# Patient Record
Sex: Male | Born: 1965
Health system: Southern US, Community
[De-identification: ages and names within clinical notes are randomized; demographics above are authoritative.]

## PROBLEM LIST (undated history)

## (undated) DIAGNOSIS — M2550 Pain in unspecified joint: Secondary | ICD-10-CM

## (undated) DIAGNOSIS — R7303 Prediabetes: Secondary | ICD-10-CM

## (undated) DIAGNOSIS — G4730 Sleep apnea, unspecified: Secondary | ICD-10-CM

## (undated) DIAGNOSIS — Z889 Allergy status to unspecified drugs, medicaments and biological substances status: Secondary | ICD-10-CM

## (undated) DIAGNOSIS — G4733 Obstructive sleep apnea (adult) (pediatric): Secondary | ICD-10-CM

## (undated) DIAGNOSIS — R7989 Other specified abnormal findings of blood chemistry: Secondary | ICD-10-CM

## (undated) DIAGNOSIS — M549 Dorsalgia, unspecified: Secondary | ICD-10-CM

## (undated) DIAGNOSIS — I10 Essential (primary) hypertension: Secondary | ICD-10-CM

## (undated) DIAGNOSIS — R12 Heartburn: Secondary | ICD-10-CM

## (undated) DIAGNOSIS — G43909 Migraine, unspecified, not intractable, without status migrainosus: Secondary | ICD-10-CM

## (undated) DIAGNOSIS — K760 Fatty (change of) liver, not elsewhere classified: Secondary | ICD-10-CM

## (undated) DIAGNOSIS — R945 Abnormal results of liver function studies: Secondary | ICD-10-CM

## (undated) DIAGNOSIS — T7840XA Allergy, unspecified, initial encounter: Secondary | ICD-10-CM

## (undated) DIAGNOSIS — K219 Gastro-esophageal reflux disease without esophagitis: Secondary | ICD-10-CM

## (undated) DIAGNOSIS — Z9189 Other specified personal risk factors, not elsewhere classified: Secondary | ICD-10-CM

## (undated) HISTORY — DX: Heartburn: R12

## (undated) HISTORY — PX: OTHER SURGICAL HISTORY: SHX169

## (undated) HISTORY — DX: Pain in unspecified joint: M25.50

## (undated) HISTORY — DX: Allergy status to unspecified drugs, medicaments and biological substances: Z88.9

## (undated) HISTORY — DX: Hemochromatosis, unspecified: E83.119

## (undated) HISTORY — DX: Obstructive sleep apnea (adult) (pediatric): G47.33

## (undated) HISTORY — DX: Migraine, unspecified, not intractable, without status migrainosus: G43.909

## (undated) HISTORY — DX: Allergy, unspecified, initial encounter: T78.40XA

## (undated) HISTORY — DX: Essential (primary) hypertension: I10

## (undated) HISTORY — DX: Other specified abnormal findings of blood chemistry: R79.89

## (undated) HISTORY — DX: Fatty (change of) liver, not elsewhere classified: K76.0

## (undated) HISTORY — DX: Gastro-esophageal reflux disease without esophagitis: K21.9

## (undated) HISTORY — DX: Prediabetes: R73.03

## (undated) HISTORY — DX: Dorsalgia, unspecified: M54.9

## (undated) HISTORY — DX: Sleep apnea, unspecified: G47.30

## (undated) HISTORY — DX: Abnormal results of liver function studies: R94.5

## (undated) HISTORY — DX: Other specified personal risk factors, not elsewhere classified: Z91.89

---

## 2001-01-17 ENCOUNTER — Encounter: Admission: RE | Admit: 2001-01-17 | Discharge: 2001-01-17 | Payer: Self-pay | Admitting: Internal Medicine

## 2001-01-17 ENCOUNTER — Encounter: Payer: Self-pay | Admitting: Internal Medicine

## 2001-09-16 ENCOUNTER — Emergency Department (HOSPITAL_COMMUNITY): Admission: EM | Admit: 2001-09-16 | Discharge: 2001-09-16 | Payer: Self-pay | Admitting: Emergency Medicine

## 2011-01-01 ENCOUNTER — Other Ambulatory Visit: Payer: Self-pay | Admitting: Family Medicine

## 2011-01-01 DIAGNOSIS — R1031 Right lower quadrant pain: Secondary | ICD-10-CM

## 2011-01-07 ENCOUNTER — Ambulatory Visit
Admission: RE | Admit: 2011-01-07 | Discharge: 2011-01-07 | Disposition: A | Discharge status: Home / Self Care | Payer: 59 | Source: Ambulatory Visit | Attending: Family Medicine | Admitting: Family Medicine

## 2011-01-07 DIAGNOSIS — R1031 Right lower quadrant pain: Secondary | ICD-10-CM

## 2013-05-14 ENCOUNTER — Ambulatory Visit: Payer: 59 | Admitting: Cardiology

## 2013-10-26 ENCOUNTER — Encounter: Payer: Self-pay | Admitting: *Deleted

## 2014-08-15 ENCOUNTER — Ambulatory Visit
Admission: RE | Admit: 2014-08-15 | Discharge: 2014-08-15 | Disposition: A | Discharge status: Home / Self Care | Payer: 59 | Source: Ambulatory Visit | Attending: Family Medicine | Admitting: Family Medicine

## 2014-08-15 ENCOUNTER — Other Ambulatory Visit: Payer: Self-pay | Admitting: Family Medicine

## 2014-08-15 DIAGNOSIS — R52 Pain, unspecified: Secondary | ICD-10-CM

## 2015-06-05 ENCOUNTER — Other Ambulatory Visit: Payer: Self-pay | Admitting: Family Medicine

## 2015-06-05 DIAGNOSIS — R748 Abnormal levels of other serum enzymes: Secondary | ICD-10-CM

## 2015-06-10 ENCOUNTER — Other Ambulatory Visit: Payer: 59

## 2015-06-13 ENCOUNTER — Ambulatory Visit
Admission: RE | Admit: 2015-06-13 | Discharge: 2015-06-13 | Disposition: A | Discharge status: Home / Self Care | Payer: 59 | Source: Ambulatory Visit | Attending: Family Medicine | Admitting: Family Medicine

## 2015-06-13 DIAGNOSIS — R748 Abnormal levels of other serum enzymes: Secondary | ICD-10-CM

## 2016-06-03 DIAGNOSIS — K219 Gastro-esophageal reflux disease without esophagitis: Secondary | ICD-10-CM | POA: Diagnosis not present

## 2016-06-03 DIAGNOSIS — I10 Essential (primary) hypertension: Secondary | ICD-10-CM | POA: Diagnosis not present

## 2016-06-03 DIAGNOSIS — J309 Allergic rhinitis, unspecified: Secondary | ICD-10-CM | POA: Diagnosis not present

## 2016-07-05 IMAGING — CR DG HIP (WITH OR WITHOUT PELVIS) 2-3V*R*
3 series · 3 of 3 positions shown · non-contrast
Comparison: CT 01/07/2011

CLINICAL DATA: 49-year-old male with a history of right anterior
hip pain. No trauma.

EXAM:
RIGHT HIP (WITH PELVIS) 2-3 VIEWS

[w pelvis (1 of 2)]
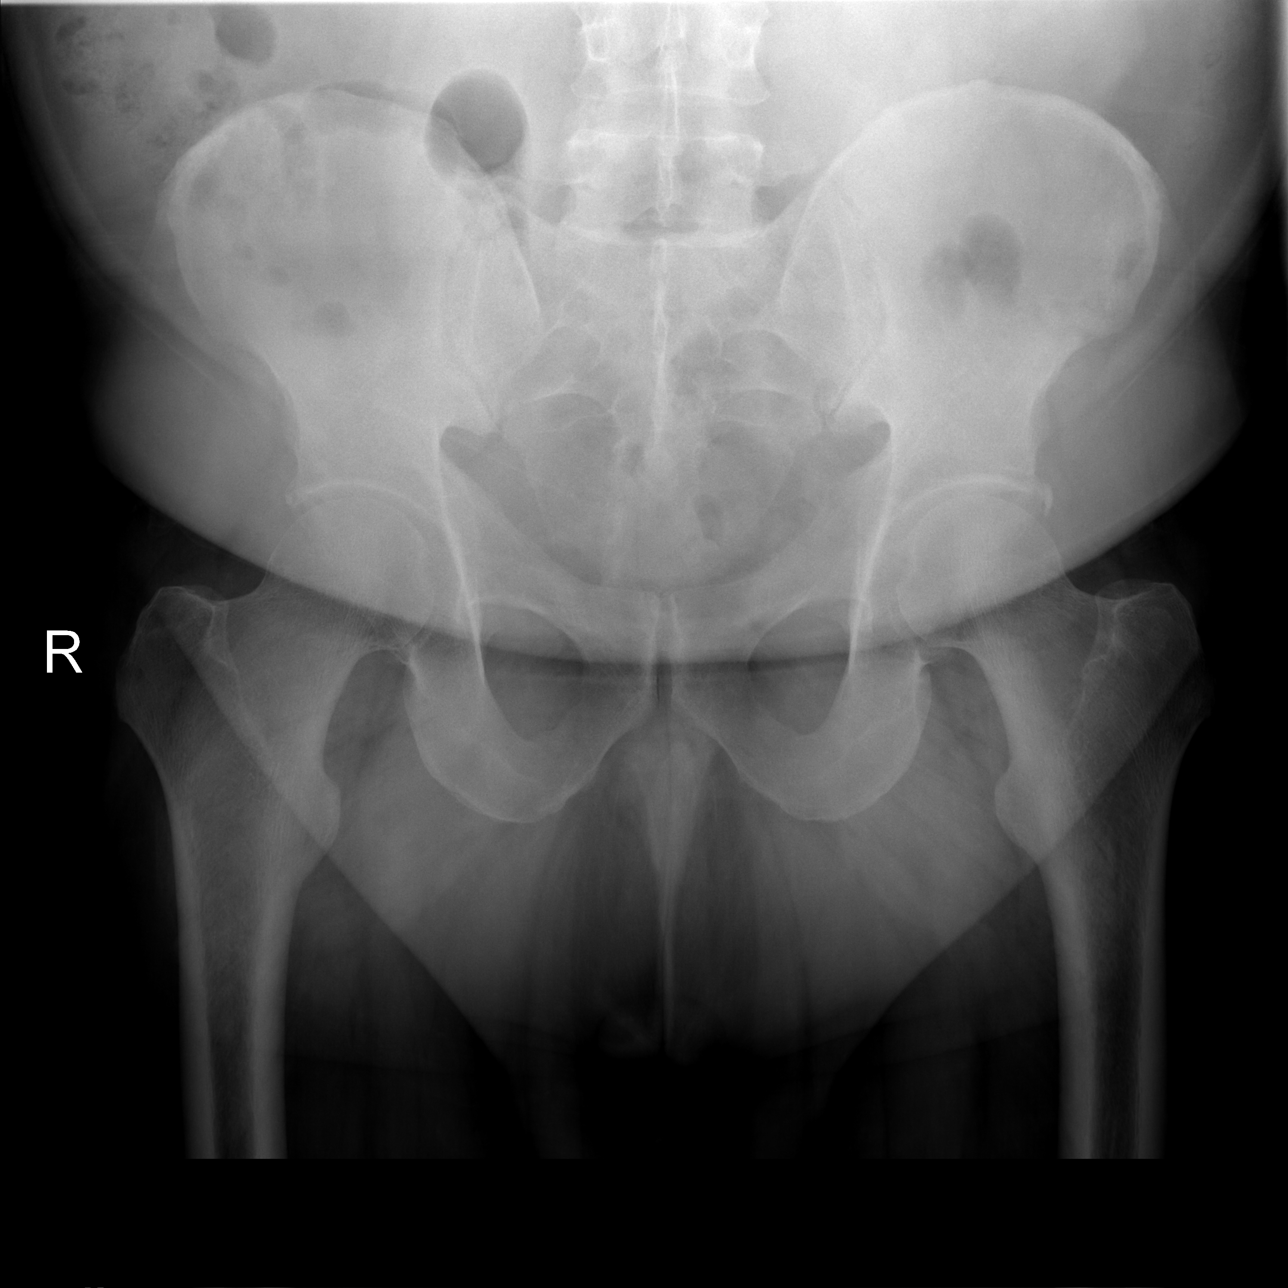

[w pelvis (2 of 2)]
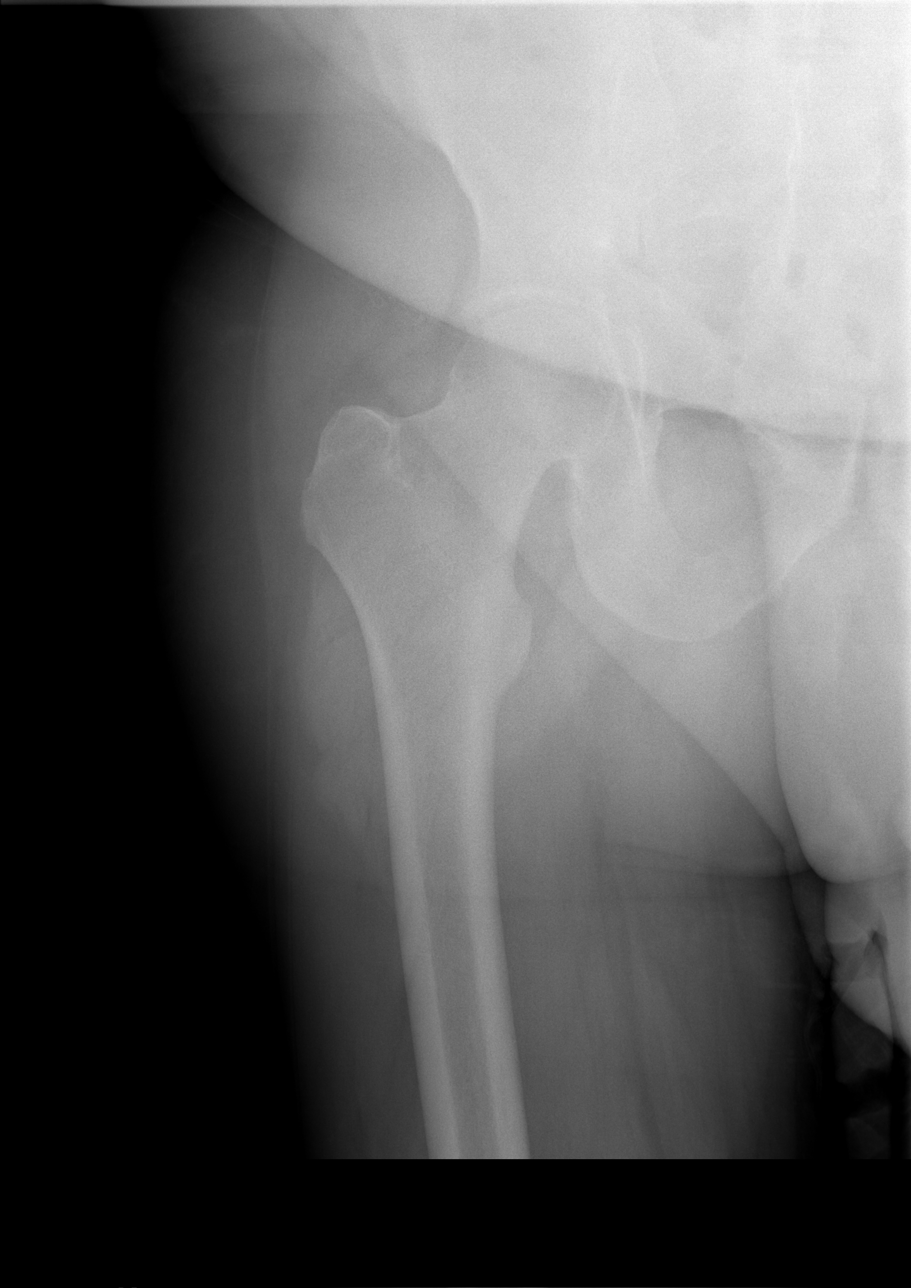

[t hip frog leg right]
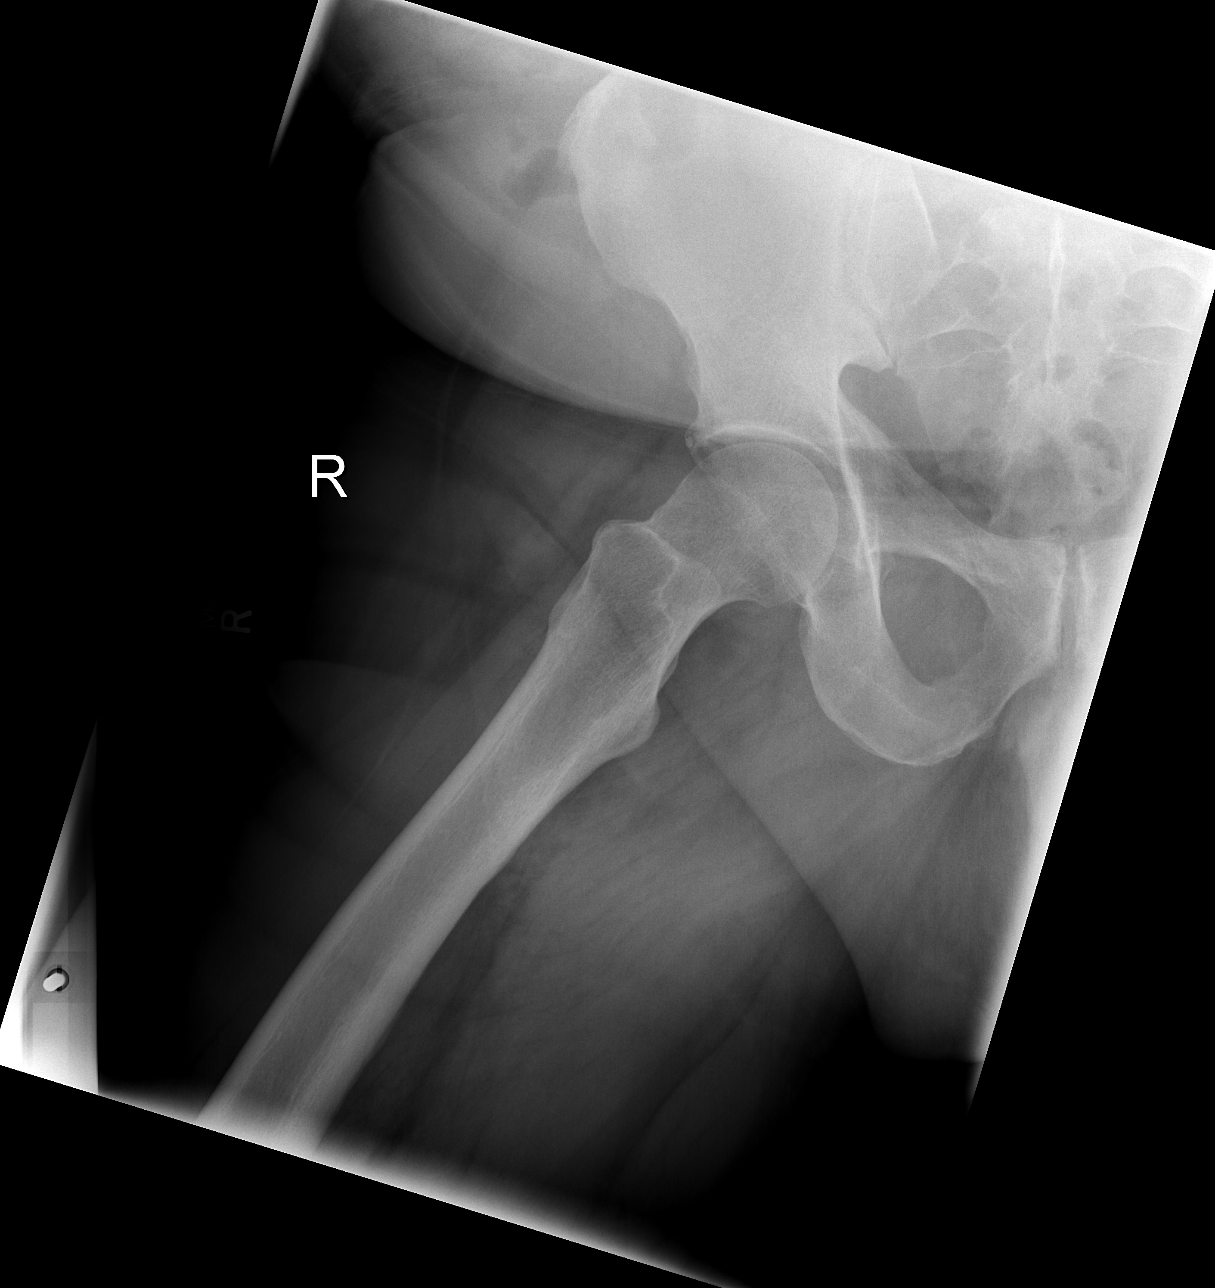

[3 of 3 positions shown; findings below may reference images not displayed]

FINDINGS: Bony pelvic ring appears intact. No acute bony abnormality.
Bilateral hips projects normally over the acetabula. Visualized
aspects the proximal femurs unremarkable.

Right hip appears congruent. Fat planes are maintained on the
anterior view.

Mild changes of osteoarthritis bilaterally.

The configuration of the femoral head and neck junction on the right
demonstrates obtuse margin at the transition, which can be seen with
CAM type femoroacetabular impingement syndrome.
IMPRESSION: Negative for acute bony abnormality.

Mild changes of osteoarthritis.

The aspherical configuration of the right femoral head/ neck
junction with obtuse transition can be seen with CAM type
femoroacetabular impingement.

## 2016-11-19 DIAGNOSIS — G4733 Obstructive sleep apnea (adult) (pediatric): Secondary | ICD-10-CM | POA: Diagnosis not present

## 2016-11-30 DIAGNOSIS — M25561 Pain in right knee: Secondary | ICD-10-CM | POA: Diagnosis not present

## 2016-12-07 DIAGNOSIS — I10 Essential (primary) hypertension: Secondary | ICD-10-CM | POA: Diagnosis not present

## 2016-12-07 DIAGNOSIS — K219 Gastro-esophageal reflux disease without esophagitis: Secondary | ICD-10-CM | POA: Diagnosis not present

## 2016-12-07 DIAGNOSIS — Z125 Encounter for screening for malignant neoplasm of prostate: Secondary | ICD-10-CM | POA: Diagnosis not present

## 2016-12-07 DIAGNOSIS — Z0000 Encounter for general adult medical examination without abnormal findings: Secondary | ICD-10-CM | POA: Diagnosis not present

## 2016-12-27 DIAGNOSIS — M25561 Pain in right knee: Secondary | ICD-10-CM | POA: Diagnosis not present

## 2017-05-03 IMAGING — US US ABDOMEN COMPLETE
1 series · 13 of 25 positions shown · non-contrast
Comparison: CT abdomen and pelvis January 07, 2011

CLINICAL DATA: Elevated liver enzymes

EXAM:
ABDOMEN ULTRASOUND COMPLETE

[Series 1: us abdomen complete · 0.32mm/px · 13 of 70 slices shown]
[im 1/70]
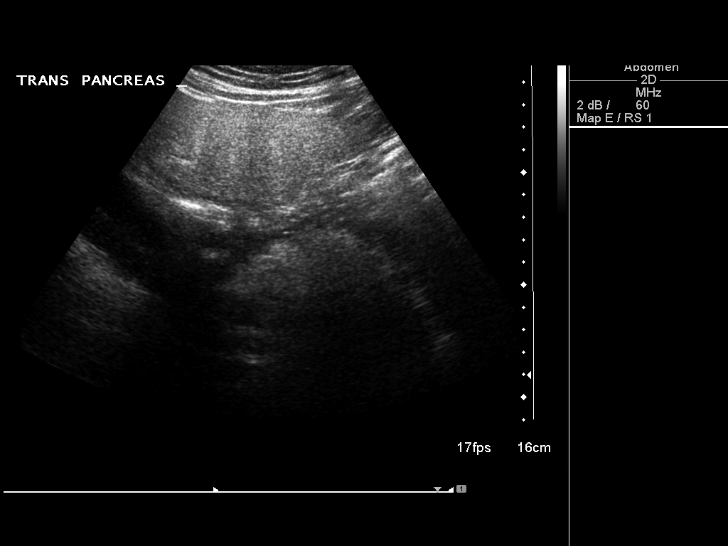
[im 6/70]
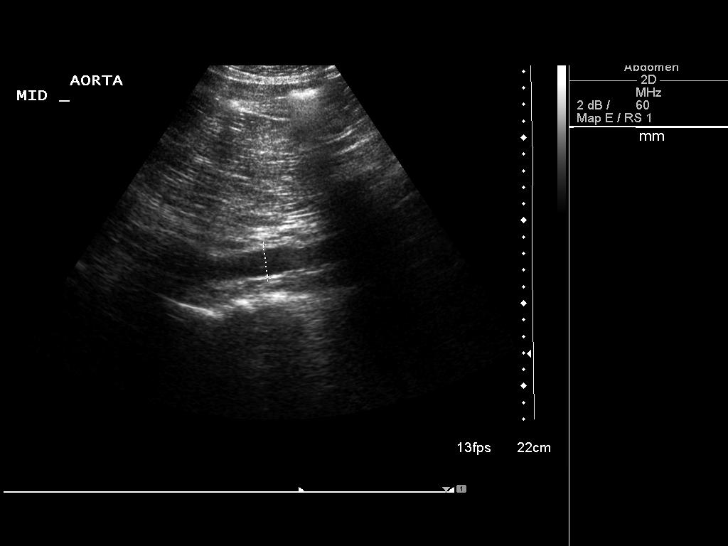
[im 12/70]
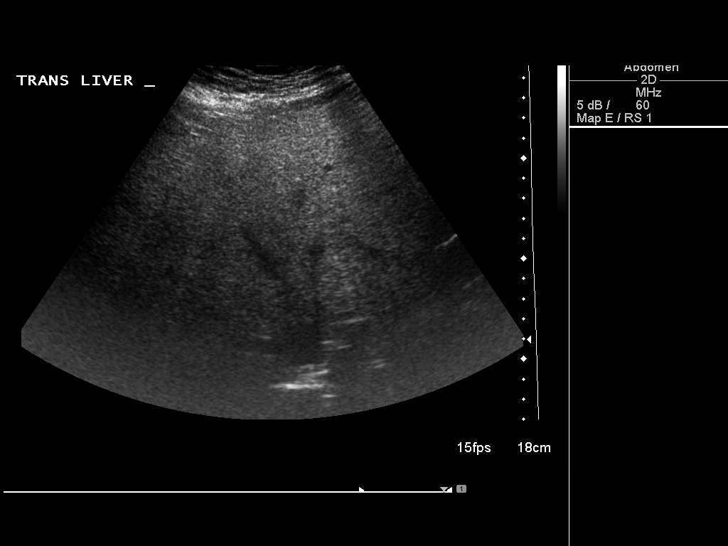
[im 18/70]
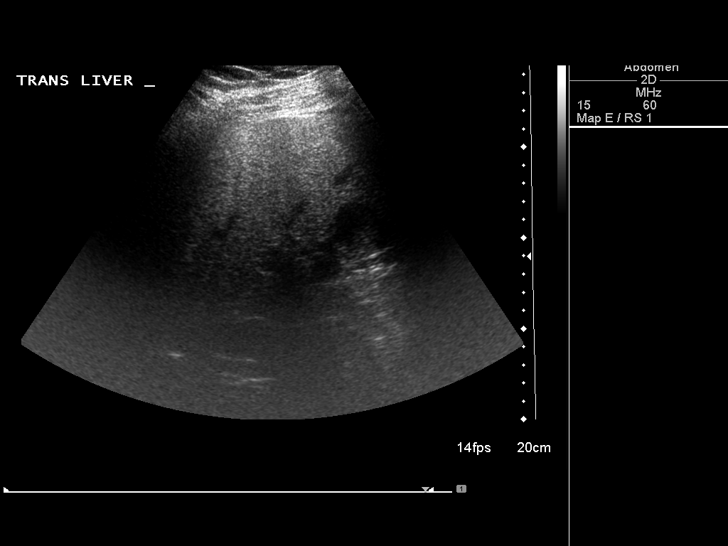
[im 24/70]
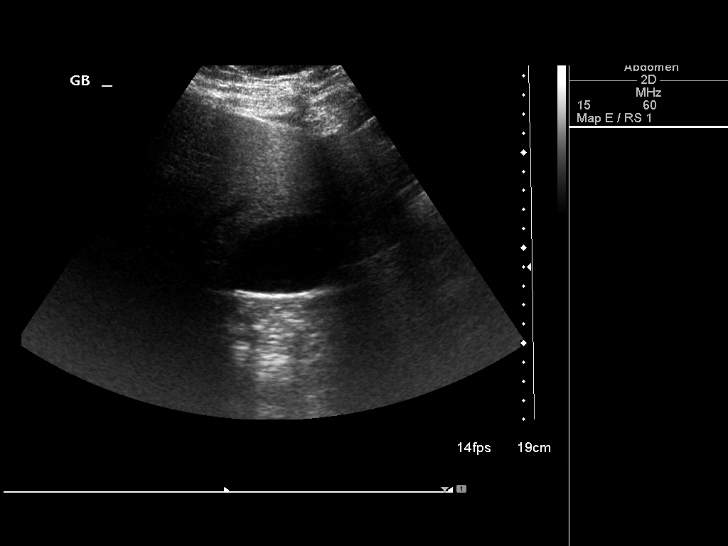
[im 29/70]
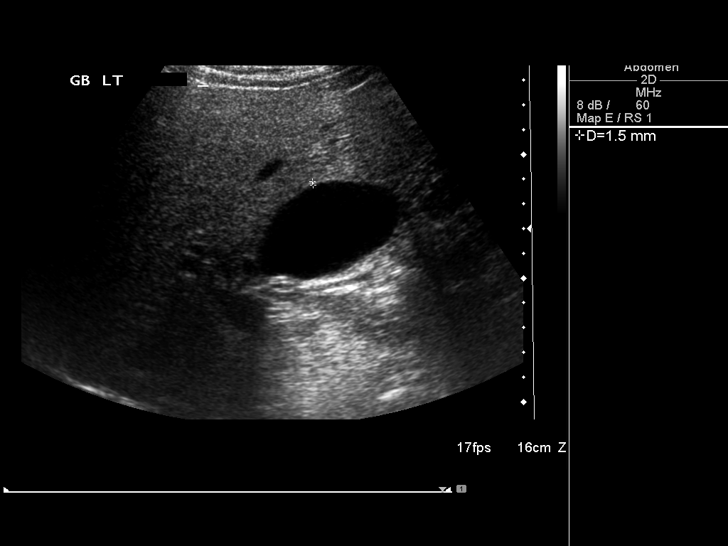
[im 35/70]
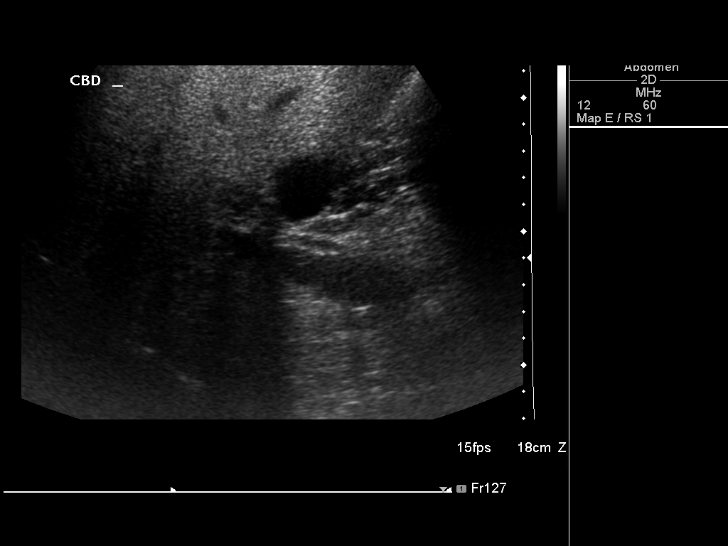
[im 41/70]
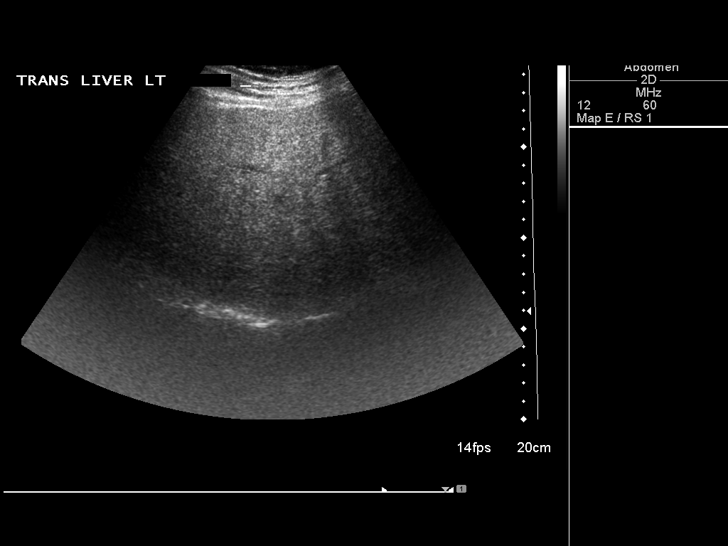
[im 47/70]
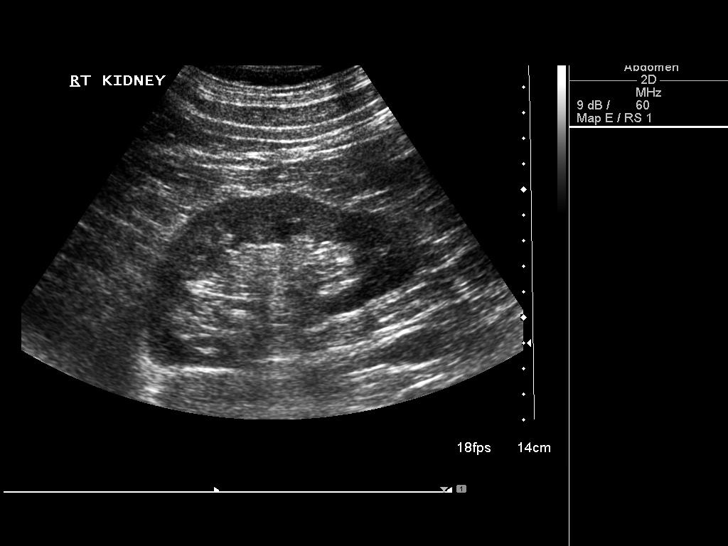
[im 52/70]
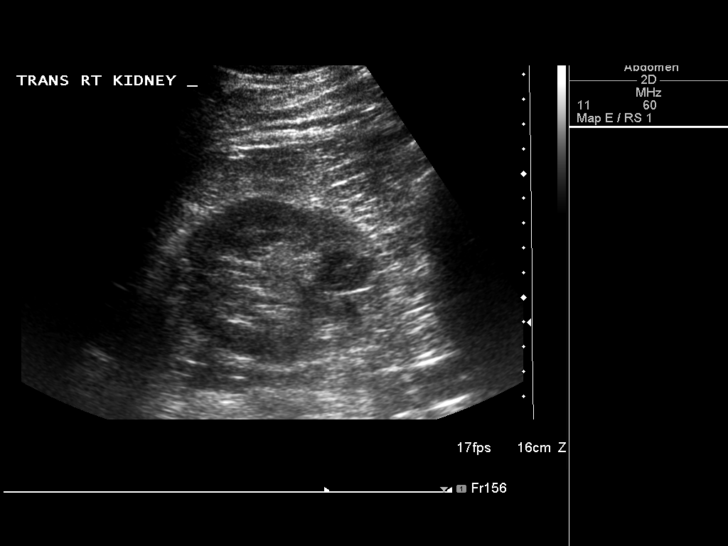
[im 58/70]
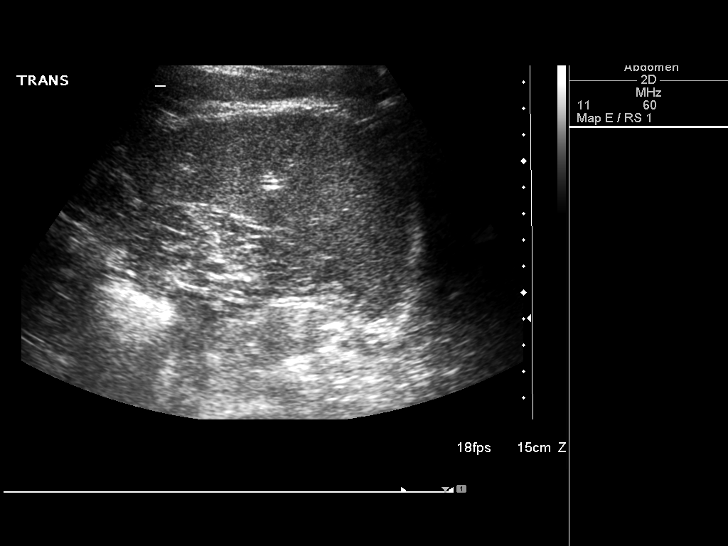
[im 64/70]
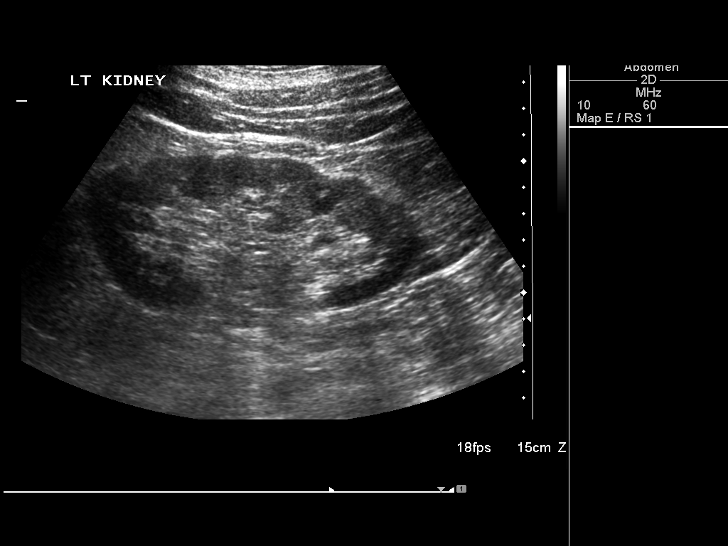
[im 70/70]
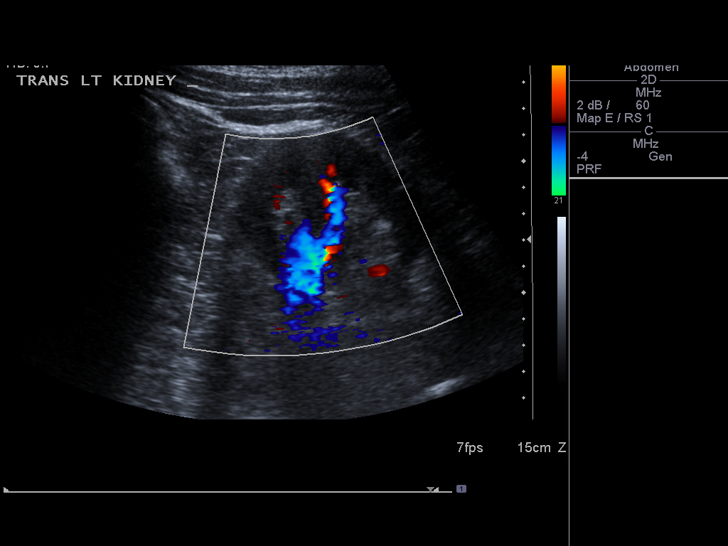

[13 of 25 positions shown; findings below may reference images not displayed]

FINDINGS: Gallbladder: No gallstones or wall thickening visualized. There is
no pericholecystic fluid. No sonographic Murphy sign noted by
sonographer.

Common bile duct: Diameter: 3 mm. There is no intrahepatic, common
hepatic, or common bile duct dilatation.

Liver: No focal lesion identified. Liver echogenicity is diffusely
increased.

IVC: No abnormality visualized.

Pancreas: Visualized portion unremarkable. Portions of the pancreas
are obscured by gas.

Spleen: Size and appearance within normal limits.

Right Kidney: Length: 11.9 cm. Echogenicity within normal limits. No
mass or hydronephrosis visualized.

Left Kidney: Length: 12.2 cm. Echogenicity within normal limits. No
mass or hydronephrosis visualized.

Abdominal aorta: No aneurysm visualized.

Other findings: No demonstrable ascites.
IMPRESSION: Diffuse increase in liver echogenicity, a finding most likely due to
hepatic steatosis. While no focal liver lesions are identified, it
must be cautioned that the sensitivity of ultrasound for focal liver
lesions is diminished in this circumstance. Portions of the pancreas
are obscured by gas. The visualized portions of the pancreas appear
normal. Study otherwise unremarkable.

## 2017-06-09 DIAGNOSIS — J309 Allergic rhinitis, unspecified: Secondary | ICD-10-CM | POA: Diagnosis not present

## 2017-06-09 DIAGNOSIS — K219 Gastro-esophageal reflux disease without esophagitis: Secondary | ICD-10-CM | POA: Diagnosis not present

## 2017-06-09 DIAGNOSIS — I10 Essential (primary) hypertension: Secondary | ICD-10-CM | POA: Diagnosis not present

## 2017-08-03 DIAGNOSIS — G4733 Obstructive sleep apnea (adult) (pediatric): Secondary | ICD-10-CM | POA: Diagnosis not present

## 2017-12-16 DIAGNOSIS — Z0000 Encounter for general adult medical examination without abnormal findings: Secondary | ICD-10-CM | POA: Diagnosis not present

## 2017-12-16 DIAGNOSIS — I10 Essential (primary) hypertension: Secondary | ICD-10-CM | POA: Diagnosis not present

## 2017-12-16 DIAGNOSIS — R7301 Impaired fasting glucose: Secondary | ICD-10-CM | POA: Diagnosis not present

## 2017-12-16 DIAGNOSIS — K219 Gastro-esophageal reflux disease without esophagitis: Secondary | ICD-10-CM | POA: Diagnosis not present

## 2017-12-16 DIAGNOSIS — Z13220 Encounter for screening for lipoid disorders: Secondary | ICD-10-CM | POA: Diagnosis not present

## 2018-02-01 DIAGNOSIS — G4733 Obstructive sleep apnea (adult) (pediatric): Secondary | ICD-10-CM | POA: Diagnosis not present

## 2018-06-30 DIAGNOSIS — K219 Gastro-esophageal reflux disease without esophagitis: Secondary | ICD-10-CM | POA: Diagnosis not present

## 2018-06-30 DIAGNOSIS — J309 Allergic rhinitis, unspecified: Secondary | ICD-10-CM | POA: Diagnosis not present

## 2018-06-30 DIAGNOSIS — I10 Essential (primary) hypertension: Secondary | ICD-10-CM | POA: Diagnosis not present

## 2018-07-12 ENCOUNTER — Telehealth: Payer: Self-pay | Admitting: Cardiology

## 2018-07-12 NOTE — Telephone Encounter (Signed)
Reached out to patient and found out he has not seen the provider in more than 3 years. Encouraged patient to obtain a download from his DME on his unit as he has an old machine (S9) as our office is not able to download the data. At that time I will schedule patient to see the provider.  Pt is aware and agreeable to treatment.

## 2018-07-12 NOTE — Telephone Encounter (Signed)
New Message:     Pt needs a prescription for a new C-Pap machine.

## 2018-07-13 ENCOUNTER — Telehealth: Payer: Self-pay | Admitting: *Deleted

## 2018-07-13 NOTE — Telephone Encounter (Signed)
REFERRAL SENT TO SCHEDULING AND NOTES ON FILE FROM DR. [facility] [phone].

## 2018-07-26 NOTE — Telephone Encounter (Signed)
Pt states he had the results from his CPAP chip downloaded, and the results should have been sent to the office yesterday.  Please call to further the process of getting a new cpap machine

## 2018-07-27 ENCOUNTER — Telehealth: Payer: Self-pay | Admitting: *Deleted

## 2018-07-27 NOTE — Telephone Encounter (Signed)
-----   Message from Quintella Reichert, MD sent at 07/25/2018  6:59 PM EDT ----- Good AHI and compliance.  Continue current PAP settings.

## 2018-07-27 NOTE — Telephone Encounter (Signed)
Patient is aware and agreeable to AHI being within range at 0.2. Patient is aware and agreeable to being in compliance with machine usage. Patient is aware and agreeable to no change in current pressures.  

## 2018-07-27 NOTE — Telephone Encounter (Signed)
Returned call to patient to confirm I did receive his download and will check to see if he is eligible to receive a new machine with his dme as there are no office visits in his chart for Dr Mayford Knife.

## 2018-07-28 ENCOUNTER — Telehealth: Payer: Self-pay | Admitting: Cardiology

## 2018-07-28 NOTE — Telephone Encounter (Signed)
New Message   Antonio Cella is calling and is requesting a new order be sent for a new cpap machine

## 2018-07-28 NOTE — Telephone Encounter (Signed)
Patient was advised he has to have an office visit with Dr Mayford Knife before he can get a new cpap as he is a new patient referral. Dr Randel Books office has been advised of this rule and understanding was verbalized. Jasmine will call the patient back.

## 2018-07-30 NOTE — Telephone Encounter (Signed)
Video visit is fine

## 2018-08-01 ENCOUNTER — Other Ambulatory Visit: Payer: Self-pay

## 2018-08-01 NOTE — Telephone Encounter (Signed)
Called patient to inform him of his upcoming e-visit. Left detailed message on voicemail with date July 7 and time 8:50 of e-visit and informed patient to call back to confirm or reschedule.

## 2018-08-03 ENCOUNTER — Telehealth: Payer: Self-pay

## 2018-08-03 NOTE — Telephone Encounter (Signed)
Called and spoke with patient about having vs and medications ready to review the day of his appointment and got his verbal consent.      Virtual Visit Pre-Appointment Phone Call  "(Name), I am calling you today to discuss your upcoming appointment. We are currently trying to limit exposure to the virus that causes COVID-19 by seeing patients at home rather than in the office."  1. "What is the BEST phone number to call the day of the visit?" - include this in appointment notes  2. "Do you have or have access to (through a family member/friend) a smartphone with video capability that we can use for your visit?" a. If yes - list this number in appt notes as "cell" (if different from BEST phone #) and list the appointment type as a VIDEO visit in appointment notes b. If no - list the appointment type as a PHONE visit in appointment notes  3. Confirm consent - "In the setting of the current Covid19 crisis, you are scheduled for a (phone or video) visit with your provider on (date) at (time).  Just as we do with many in-office visits, in order for you to participate in this visit, we must obtain consent.  If you'd like, I can send this to your mychart (if signed up) or email for you to review.  Otherwise, I can obtain your verbal consent now.  All virtual visits are billed to your insurance company just like a normal visit would be.  By agreeing to a virtual visit, we'd like you to understand that the technology does not allow for your provider to perform an examination, and thus may limit your provider's ability to fully assess your condition. If your provider identifies any concerns that need to be evaluated in person, we will make arrangements to do so.  Finally, though the technology is pretty good, we cannot assure that it will always work on either your or our end, and in the setting of a video visit, we may have to convert it to a phone-only visit.  In either situation, we cannot ensure that we  have a secure connection.  Are you willing to proceed?" STAFF: Did the patient verbally acknowledge consent to telehealth visit? Document YES/NO here: YES  4. Advise patient to be prepared - "Two hours prior to your appointment, go ahead and check your blood pressure, pulse, oxygen saturation, and your weight (if you have the equipment to check those) and write them all down. When your visit starts, your provider will ask you for this information. If you have an Apple Watch or Kardia device, please plan to have heart rate information ready on the day of your appointment. Please have a pen and paper handy nearby the day of the visit as well."  5. Give patient instructions for MyChart download to smartphone OR Doximity/Doxy.me as below if video visit (depending on what platform provider is using)  6. Inform patient they will receive a phone call 15 minutes prior to their appointment time (may be from unknown caller ID) so they should be prepared to answer    TELEPHONE CALL NOTE  Antonio Hanson [place] has been deemed a candidate for a follow-up tele-health visit to limit community exposure during the Covid-19 pandemic. I spoke with the patient via phone to ensure availability of phone/video source, confirm preferred email & phone number, and discuss instructions and expectations.  I reminded Antonio Hanson [place] to be prepared with any vital sign and/or heart rhythm information  that could potentially be obtained via home monitoring, at the time of his visit. I reminded QUADRI PERDOMO to expect a phone call prior to his visit.  Kadarrius Yanke, CMA 08/03/2018 4:18 PM   INSTRUCTIONS FOR DOWNLOADING THE MYCHART APP TO SMARTPHONE  - The patient must first make sure to have activated MyChart and know their login information - If Apple, go to [employer] and type in MyChart in the search bar and download the app. If Android, ask patient to go to [employer] and type in [place] in the search bar and download the app.  The app is free but as with any other app downloads, their phone may require them to verify saved payment information or Apple/Android password.  - The patient will need to then log into the app with their MyChart username and password, and select [facility] as their healthcare provider to link the account. When it is time for your visit, go to the MyChart app, find appointments, and click Begin Video Visit. Be sure to Select Allow for your device to access the Microphone and Camera for your visit. You will then be connected, and your provider will be with you shortly.  **If they have any issues connecting, or need assistance please contact MyChart service desk [phone])**  **If using a computer, in order to ensure the best quality for their visit they will need to use either of the following Internet Browsers: [employer] or Google Chrome**  IF USING DOXIMITY or DOXY.ME - The patient will receive a link just prior to their visit by text.     FULL LENGTH CONSENT FOR TELE-HEALTH VISIT   I hereby voluntarily request, consent and authorize [facility] and its employed or contracted physicians, physician assistants, nurse practitioners or other licensed health care professionals (the Practitioner), to provide me with telemedicine health care services (the "Services") as deemed necessary by the treating Practitioner. I acknowledge and consent to receive the Services by the Practitioner via telemedicine. I understand that the telemedicine visit will involve communicating with the Practitioner through live audiovisual communication technology and the disclosure of certain medical information by electronic transmission. I acknowledge that I have been given the opportunity to request an in-person assessment or other available alternative prior to the telemedicine visit and am voluntarily participating in the telemedicine visit.  I understand that I have the right to withhold or  withdraw my consent to the use of telemedicine in the course of my care at any time, without affecting my right to future care or treatment, and that the Practitioner or I may terminate the telemedicine visit at any time. I understand that I have the right to inspect all information obtained and/or recorded in the course of the telemedicine visit and may receive copies of available information for a reasonable fee.  I understand that some of the potential risks of receiving the Services via telemedicine include:  Marland Kitchen Delay or interruption in medical evaluation due to technological equipment failure or disruption; . Information transmitted may not be sufficient (e.g. poor resolution of images) to allow for appropriate medical decision making by the Practitioner; and/or  . In rare instances, security protocols could fail, causing a breach of personal health information.  Furthermore, I acknowledge that it is my responsibility to provide information about my medical history, conditions and care that is complete and accurate to the best of my ability. I acknowledge that Practitioner's advice, recommendations, and/or decision may be based on factors not  within their control, such as incomplete or inaccurate data provided by me or distortions of diagnostic images or specimens that may result from electronic transmissions. I understand that the practice of medicine is not an exact science and that Practitioner makes no warranties or guarantees regarding treatment outcomes. I acknowledge that I will receive a copy of this consent concurrently upon execution via email to the email address I last provided but may also request a printed copy by calling the office of [facility].    I understand that my insurance will be billed for this visit.   I have read or had this consent read to me. . I understand the contents of this consent, which adequately explains the benefits and risks of the Services being provided via  telemedicine.  . I have been provided ample opportunity to ask questions regarding this consent and the Services and have had my questions answered to my satisfaction. . I give my informed consent for the services to be provided through the use of telemedicine in my medical care  By participating in this telemedicine visit I agree to the above.

## 2018-08-07 ENCOUNTER — Encounter: Payer: Self-pay | Admitting: Cardiology

## 2018-08-07 DIAGNOSIS — E669 Obesity, unspecified: Secondary | ICD-10-CM | POA: Insufficient documentation

## 2018-08-07 DIAGNOSIS — R0683 Snoring: Secondary | ICD-10-CM | POA: Insufficient documentation

## 2018-08-07 DIAGNOSIS — G4733 Obstructive sleep apnea (adult) (pediatric): Secondary | ICD-10-CM | POA: Insufficient documentation

## 2018-08-07 DIAGNOSIS — I10 Essential (primary) hypertension: Secondary | ICD-10-CM | POA: Insufficient documentation

## 2018-08-07 DIAGNOSIS — Z9989 Dependence on other enabling machines and devices: Secondary | ICD-10-CM | POA: Insufficient documentation

## 2018-08-07 NOTE — Progress Notes (Signed)
Virtual Visit via Video Note   This visit type was conducted due to national recommendations for restrictions regarding the COVID-19 Pandemic (e.g. social distancing) in an effort to limit this patient's exposure and mitigate transmission in our community.  Due to his co-morbid illnesses, this patient is at least at moderate risk for complications without adequate follow up.  This format is felt to be most appropriate for this patient at this time.  All issues noted in this document were discussed and addressed.  A limited physical exam was performed with this format.  Please refer to the patient's chart for his consent to telehealth for [facility].   Evaluation Performed: Cardiology consult  This visit type was conducted due to national recommendations for restrictions regarding the COVID-19 Pandemic (e.g. social distancing).  This format is felt to be most appropriate for this patient at this time.  All issues noted in this document were discussed and addressed.  No physical exam was performed (except for noted visual exam findings with Video Visits).  Please refer to the patient's chart (MyChart message for video visits and phone note for telephone visits) for the patient's consent to telehealth for [facility].  Date:  08/08/2018   ID:  Antonio Hanson, DOB [birth year], MRN [id number]  Patient Location:  Home  Provider location:   [facility]  PCP:  No primary care provider on file.  Sleep Medicine:  Armanda Magic, MD Electrophysiologist:  None   Chief Complaint:  OSA  History of Present Illness:    Antonio Hanson is a 52 y.o. male who presents via audio/video conferencing for a telehealth visit today in referral by his PCP Dr. Manus Gunning.    This is a 53 year old obese male with a history of severe obstructive sleep apnea diagnosed by sleep study in 2014 with an AHI of 19/h.  He underwent CPAP titration and has been on CPAP.  He was seen by me last in 2014 lost to follow-up.  He is  now here to reestablish care.Marland Kitchen  He is doing well with his CPAP device and thinks that he has gotten used to it.  He tolerates the mask and feels the pressure is adequate.  Since going on CPAP a feels rested in the am and has no significant daytime sleepiness.  He denies any significant mouth or nasal dryness or nasal congestion.  He does not think that he snores.    The patient does not have symptoms concerning for COVID-19 infection (fever, chills, cough, or new shortness of breath).    Prior CV studies:   The following studies were reviewed today:  PAP compliance data  Past Medical History:  Diagnosis Date  . Elevated LFTs   . GERD (gastroesophageal reflux disease)   . H/O multiple allergies   . Hemochromatosis   . HTN (hypertension)    white coat component  . Migraines   . OSA on CPAP    severe OSA with AHI 92/hr by sleep study 2014 on CPAP   Past Surgical History:  Procedure Laterality Date  . none       Current Meds  Medication Sig  . lisinopril (PRINIVIL,ZESTRIL) 20 MG tablet Take 20 mg by mouth daily.  Marland Kitchen loratadine (CLARITIN) 10 MG tablet Take 10 mg by mouth daily.  . metoCLOPramide (REGLAN) 10 MG tablet Take 10 mg by mouth every 6 (six) hours as needed.   Marland Kitchen omeprazole (PRILOSEC) 40 MG capsule Take 40 mg by mouth daily.  . promethazine (PHENERGAN) 25  MG tablet Take 25 mg by mouth every 8 (eight) hours as needed for nausea or vomiting.     Allergies:   Patient has no known allergies.   Social History   Tobacco Use  . Smoking status: Former Games developermoker  . Smokeless tobacco: Never Used  Substance Use Topics  . Alcohol use: Not on file  . Drug use: Not on file     Family Hx: The patient's family history includes Diabetes in his father; Hypertension in his father; Lymphoma in his mother.  ROS:   Please see the history of present illness.     All other systems reviewed and are negative.   Labs/Other Tests and Data Reviewed:    Recent Labs: No results found for  requested labs within last 8760 hours.   Recent Lipid Panel No results found for: CHOL, TRIG, HDL, CHOLHDL, LDLCALC, LDLDIRECT  Wt Readings from Last 3 Encounters:  08/08/18 254 lb (115.2 kg)     Objective:    Vital Signs:  BP 123/83   Pulse (!) 103   Ht 5\' 9"  (1.753 m)   Wt 254 lb (115.2 kg)   BMI 37.51 kg/m    CONSTITUTIONAL:  Well nourished, well developed male in no acute distress.  EYES: anicteric MOUTH: oral mucosa is pink RESPIRATORY: Normal respiratory effort, symmetric expansion CARDIOVASCULAR: No peripheral edema SKIN: No rash, lesions or ulcers MUSCULOSKELETAL: no digital cyanosis NEURO: Cranial Nerves II-XII grossly intact, moves all extremities PSYCH: Intact judgement and insight.  A&O x 3, Mood/affect appropriate   ASSESSMENT & PLAN:    1.  OSA - the patient is tolerating PAP therapy well without any problems. The PAP download was reviewed today and showed an AHI of 0.2/hr on 11 cm H2O with 100% compliance in using more than 4 hours nightly.  The patient has been using and benefiting from PAP use and will continue to benefit from therapy.  He would like to get a new device since his is over 54 years old.  I will order a new ResMed CPAP on 11 cm H2O with heated humidity and mask of choice as well as supplies.  He will need to see me back 10 weeks after he gets his new device per insurance requirements to document compliance.  2.  Hypertension - his BP is controlled.  He will continue on lisinopril 20 mg daily.  3.  Obesity - I have encouraged him to get into a routine exercise program and cut back on carbs and portions.   4.  COVID-19 Education:The signs and symptoms of COVID-19 were discussed with the patient and how to seek care for testing (follow up with PCP or arrange E-visit).  The importance of social distancing was discussed today.  Patient Risk:   After full review of this patient's clinical status, I feel that they are at least moderate risk at this time.   Time:   Today, I have spent 10 minutes directly with the patient on video discussing medical problems including OSA, HTN, obesity.  We also reviewed the symptoms of COVID 19 and the ways to protect against contracting the virus with telehealth technology.  I spent an additional 5 minutes reviewing patient's chart including PAP compliance download.  Medication Adjustments/Labs and Tests Ordered: Current medicines are reviewed at length with the patient today.  Concerns regarding medicines are outlined above.  Tests Ordered: No orders of the defined types were placed in this encounter.  Medication Changes: No orders of the defined types were  placed in this encounter.   Disposition:  Follow up in 1 year(s)  Signed, Armanda Magic, MD  08/08/2018 9:17 AM    [facility]

## 2018-08-08 ENCOUNTER — Telehealth: Payer: Self-pay | Admitting: *Deleted

## 2018-08-08 ENCOUNTER — Telehealth: Payer: Self-pay | Admitting: Cardiology

## 2018-08-08 ENCOUNTER — Encounter: Payer: Self-pay | Admitting: Cardiology

## 2018-08-08 ENCOUNTER — Telehealth (INDEPENDENT_AMBULATORY_CARE_PROVIDER_SITE_OTHER): Payer: Self-pay | Admitting: Cardiology

## 2018-08-08 ENCOUNTER — Other Ambulatory Visit: Payer: Self-pay

## 2018-08-08 DIAGNOSIS — G4733 Obstructive sleep apnea (adult) (pediatric): Secondary | ICD-10-CM

## 2018-08-08 DIAGNOSIS — E669 Obesity, unspecified: Secondary | ICD-10-CM

## 2018-08-08 DIAGNOSIS — I10 Essential (primary) hypertension: Secondary | ICD-10-CM

## 2018-08-08 DIAGNOSIS — Z9989 Dependence on other enabling machines and devices: Secondary | ICD-10-CM

## 2018-08-08 NOTE — Addendum Note (Signed)
Addended by: Dustin Flock on: 08/08/2018 10:24 AM   Modules accepted: Orders

## 2018-08-08 NOTE — Telephone Encounter (Signed)
Order placed to [employer] today

## 2018-08-08 NOTE — Telephone Encounter (Signed)
-----   Message from Dustin Flock, RN sent at 08/08/2018 10:25 AM EDT ----- CPAP device ordered, please send. Thanks, Antonio Hanson ----- Message ----- From: Quintella Reichert, MD Sent: 08/08/2018   9:11 AM EDT To: Dustin Flock, RN  Please order a ResMed CPAP at 11 cm H2O with heated humidity and mask of choice and supplies.  He will need follow-up with me 10 weeks after he receives his new device

## 2018-08-08 NOTE — Patient Instructions (Signed)
Medication Instructions:  Your physician recommends that you continue on your current medications as directed. Please refer to the Current Medication list given to you today.  If you need a refill on your cardiac medications before your next appointment, please call your pharmacy.   Lab work: None If you have labs (blood work) drawn today and your tests are completely normal, you will receive your results only by: Marland Kitchen MyChart Message (if you have MyChart) OR . A paper copy in the mail If you have any lab test that is abnormal or we need to change your treatment, we will call you to review the results.  Testing/Procedures: None  Follow-Up: Call the office once you receive your CPAP device so we can schedule a 10 week follow up.

## 2018-09-11 ENCOUNTER — Telehealth: Payer: 59 | Admitting: Cardiology

## 2019-05-18 ENCOUNTER — Ambulatory Visit: Payer: Self-pay | Attending: Internal Medicine

## 2019-05-18 DIAGNOSIS — Z23 Encounter for immunization: Secondary | ICD-10-CM

## 2019-05-18 NOTE — Progress Notes (Signed)
   Covid-19 Vaccination Clinic  Name:  Antonio Hanson    MRN: [id number] DOB: 01/14/1966  05/18/2019  Mr. Schreier was observed post Covid-19 immunization for 15 minutes without incident. He was provided with Vaccine Information Sheet and instruction to access the V-Safe system.   Mr. Kilmer was instructed to call 911 with any severe reactions post vaccine: Marland Kitchen Difficulty breathing  . Swelling of face and throat  . A fast heartbeat  . A bad rash all over body  . Dizziness and weakness   Immunizations Administered    Name Date Dose VIS Date Route   Moderna COVID-19 Vaccine 05/18/2019  8:42 AM 0.5 mL 02/06/2019 Intramuscular   Manufacturer: Moderna   Lot: [id number]   NDC: 82518-984-21

## 2019-06-19 ENCOUNTER — Ambulatory Visit: Payer: Self-pay | Attending: Internal Medicine

## 2019-06-19 DIAGNOSIS — Z23 Encounter for immunization: Secondary | ICD-10-CM

## 2019-06-19 NOTE — Progress Notes (Signed)
   Covid-19 Vaccination Clinic  Name:  MARLENE PFLUGER    MRN: [id number] DOB: December 15, 1965  06/19/2019  Mr. Fultz was observed post Covid-19 immunization for 15 minutes without incident. He was provided with Vaccine Information Sheet and instruction to access the V-Safe system.   Mr. Umland was instructed to call 911 with any severe reactions post vaccine: Marland Kitchen Difficulty breathing  . Swelling of face and throat  . A fast heartbeat  . A bad rash all over body  . Dizziness and weakness   Immunizations Administered    Name Date Dose VIS Date Route   Moderna COVID-19 Vaccine 06/19/2019  9:53 AM 0.5 mL 02/06/2019 Intramuscular   Manufacturer: Moderna   Lot: [id number]   NDC: 75102-585-27

## 2019-10-17 ENCOUNTER — Encounter (INDEPENDENT_AMBULATORY_CARE_PROVIDER_SITE_OTHER): Payer: Self-pay | Admitting: Family Medicine

## 2019-10-17 ENCOUNTER — Ambulatory Visit (INDEPENDENT_AMBULATORY_CARE_PROVIDER_SITE_OTHER): Payer: Commercial Managed Care - PPO | Admitting: Family Medicine

## 2019-10-17 ENCOUNTER — Other Ambulatory Visit: Payer: Self-pay

## 2019-10-17 VITALS — BP 120/81 | HR 82 | Temp 98.0°F | Ht 67.0 in | Wt 263.0 lb

## 2019-10-17 DIAGNOSIS — E1169 Type 2 diabetes mellitus with other specified complication: Secondary | ICD-10-CM | POA: Diagnosis not present

## 2019-10-17 DIAGNOSIS — Z0289 Encounter for other administrative examinations: Secondary | ICD-10-CM

## 2019-10-17 DIAGNOSIS — E785 Hyperlipidemia, unspecified: Secondary | ICD-10-CM

## 2019-10-17 DIAGNOSIS — E1159 Type 2 diabetes mellitus with other circulatory complications: Secondary | ICD-10-CM | POA: Diagnosis not present

## 2019-10-17 DIAGNOSIS — R5383 Other fatigue: Secondary | ICD-10-CM

## 2019-10-17 DIAGNOSIS — I10 Essential (primary) hypertension: Secondary | ICD-10-CM | POA: Diagnosis not present

## 2019-10-17 DIAGNOSIS — G4733 Obstructive sleep apnea (adult) (pediatric): Secondary | ICD-10-CM

## 2019-10-17 DIAGNOSIS — E1165 Type 2 diabetes mellitus with hyperglycemia: Secondary | ICD-10-CM | POA: Diagnosis not present

## 2019-10-17 DIAGNOSIS — K760 Fatty (change of) liver, not elsewhere classified: Secondary | ICD-10-CM

## 2019-10-17 DIAGNOSIS — Z9189 Other specified personal risk factors, not elsewhere classified: Secondary | ICD-10-CM

## 2019-10-17 DIAGNOSIS — Z1331 Encounter for screening for depression: Secondary | ICD-10-CM | POA: Diagnosis not present

## 2019-10-17 DIAGNOSIS — R0602 Shortness of breath: Secondary | ICD-10-CM | POA: Diagnosis not present

## 2019-10-17 DIAGNOSIS — Z6841 Body Mass Index (BMI) 40.0 and over, adult: Secondary | ICD-10-CM

## 2019-10-18 LAB — CBC WITH DIFFERENTIAL/PLATELET
Basophils Absolute: 0.1 10*3/uL (ref 0.0–0.2)
Basos: 1 %
EOS (ABSOLUTE): 0.2 10*3/uL (ref 0.0–0.4)
Eos: 2 %
Hematocrit: 53 % — ABNORMAL HIGH (ref 37.5–51.0)
Hemoglobin: 18.2 g/dL — ABNORMAL HIGH (ref 13.0–17.7)
Immature Grans (Abs): 0 10*3/uL (ref 0.0–0.1)
Immature Granulocytes: 0 %
Lymphocytes Absolute: 2.6 10*3/uL (ref 0.7–3.1)
Lymphs: 33 %
MCH: 30.3 pg (ref 26.6–33.0)
MCHC: 34.3 g/dL (ref 31.5–35.7)
MCV: 88 fL (ref 79–97)
Monocytes Absolute: 0.6 10*3/uL (ref 0.1–0.9)
Monocytes: 8 %
Neutrophils Absolute: 4.4 10*3/uL (ref 1.4–7.0)
Neutrophils: 56 %
Platelets: 219 10*3/uL (ref 150–450)
RBC: 6 x10E6/uL — ABNORMAL HIGH (ref 4.14–5.80)
RDW: 13 % (ref 11.6–15.4)
WBC: 7.9 10*3/uL (ref 3.4–10.8)

## 2019-10-18 LAB — COMPREHENSIVE METABOLIC PANEL
ALT: 100 IU/L — ABNORMAL HIGH (ref 0–44)
AST: 61 IU/L — ABNORMAL HIGH (ref 0–40)
Albumin/Globulin Ratio: 2 (ref 1.2–2.2)
Albumin: 4.7 g/dL (ref 3.8–4.9)
Alkaline Phosphatase: 105 IU/L (ref 48–121)
BUN/Creatinine Ratio: 10 (ref 9–20)
BUN: 8 mg/dL (ref 6–24)
Bilirubin Total: 0.7 mg/dL (ref 0.0–1.2)
CO2: 24 mmol/L (ref 20–29)
Calcium: 10 mg/dL (ref 8.7–10.2)
Chloride: 101 mmol/L (ref 96–106)
Creatinine, Ser: 0.81 mg/dL (ref 0.76–1.27)
GFR calc Af Amer: 116 mL/min/{1.73_m2} (ref >59)
GFR calc non Af Amer: 100 mL/min/{1.73_m2} (ref >59)
Globulin, Total: 2.3 g/dL (ref 1.5–4.5)
Glucose: 113 mg/dL — ABNORMAL HIGH (ref 65–99)
Potassium: 4.8 mmol/L (ref 3.5–5.2)
Sodium: 139 mmol/L (ref 134–144)
Total Protein: 7 g/dL (ref 6.0–8.5)

## 2019-10-18 LAB — T4: T4, Total: 7.4 ug/dL (ref 4.5–12.0)

## 2019-10-18 LAB — INSULIN, RANDOM: INSULIN: 28.5 u[IU]/mL — ABNORMAL HIGH (ref 2.6–24.9)

## 2019-10-18 LAB — LIPID PANEL WITH LDL/HDL RATIO
Cholesterol, Total: 194 mg/dL (ref 100–199)
HDL: 42 mg/dL (ref >39)
LDL Chol Calc (NIH): 128 mg/dL — ABNORMAL HIGH (ref 0–99)
LDL/HDL Ratio: 3 ratio (ref 0.0–3.6)
Triglycerides: 132 mg/dL (ref 0–149)
VLDL Cholesterol Cal: 24 mg/dL (ref 5–40)

## 2019-10-18 LAB — HEMOGLOBIN A1C
Est. average glucose Bld gHb Est-mCnc: 146 mg/dL
Hgb A1c MFr Bld: 6.7 % — ABNORMAL HIGH (ref 4.8–5.6)

## 2019-10-18 LAB — MICROALBUMIN / CREATININE URINE RATIO
Creatinine, Urine: 32.7 mg/dL
Microalb/Creat Ratio: 54 mg/g creat — ABNORMAL HIGH (ref 0–29)
Microalbumin, Urine: 17.6 ug/mL

## 2019-10-18 LAB — VITAMIN B12: Vitamin B-12: 462 pg/mL (ref 232–1245)

## 2019-10-18 LAB — TSH: TSH: 1.73 u[IU]/mL (ref 0.450–4.500)

## 2019-10-18 LAB — T3: T3, Total: 121 ng/dL (ref 71–180)

## 2019-10-18 LAB — VITAMIN D 25 HYDROXY (VIT D DEFICIENCY, FRACTURES): Vit D, 25-Hydroxy: 37.3 ng/mL (ref 30.0–100.0)

## 2019-10-18 LAB — FOLATE: Folate: 10.2 ng/mL (ref >3.0)

## 2019-10-23 NOTE — Progress Notes (Signed)
Chief Complaint:   Antonio Hanson (MR# [id number]) is a 54 y.o. male who presents for evaluation and treatment of obesity and related comorbidities. Current BMI is Body mass index is 41.19 kg/m. Antonio Hanson has been struggling with his weight for many years and has been unsuccessful in either losing weight, maintaining weight loss, or reaching his healthy weight goal.  Antonio Hanson was told about our clinic by a friend. Breakfast-2 eggs and 2 pork sausage patties (feels full). Mid morning-banana (hungry). Lunch-sandwich with tomato or roast beef 3 slices and mustard and mayo, with 1/3 bag of chips (feels full), with or without sweet tea. Snack-crackers in the afternoon. Dinner-kielbasa 1/3 of a 16 oz with green beans and potatoes 1 cup. After is wine-Pinot Grigio and dessert wine (3 glasses).   Antonio Hanson is currently in the action stage of change and ready to dedicate time achieving and maintaining a healthier weight. Antonio Hanson is interested in becoming our patient and working on intensive lifestyle modifications including (but not limited to) diet and exercise for weight loss.  Antonio Hanson's habits were reviewed today and are as follows: His family eats meals together, Antonio thinks his family will eat healthier with him, Antonio struggles with family and or coworkers weight loss sabotage, Antonio has been heavy most of his life, Antonio started gaining weight in 1998, his heaviest weight ever was 265 pounds, Antonio snacks frequently in the evenings, Antonio is frequently drinking liquids with calories, Antonio frequently makes poor food choices, Antonio has problems with excessive hunger, Antonio frequently eats larger portions than normal and Antonio struggles with emotional eating.  Depression Screen Antonio Hanson's Food and Mood (modified PHQ-9) score was 9.  Depression screen PHQ 2/9 10/17/2019  Decreased Interest 1  Down, Depressed, Hopeless 1  PHQ - 2 Score 2  Altered sleeping 2  Tired, decreased energy 3  Change in appetite 0  Feeling bad or failure  about yourself  1  Trouble concentrating 1  Moving slowly or fidgety/restless 0  Suicidal thoughts 0  PHQ-9 Score 9  Difficult doing work/chores Not difficult at all   Subjective:   1. Other fatigue Knowledge admits to daytime somnolence and admits to waking up still tired. Patent has a history of symptoms of daytime fatigue. Antonio Hanson, and states that Antonio has difficulty falling asleep. Snoring is not present. Apneic episodes are not present. Epworth Sleepiness Score is 5. EKG-normal sinus rhythm at 82 BPM.  2. SOB (shortness of breath) on exertion Antonio Hanson notes increasing shortness of breath with exercising and seems to be worsening over time with weight gain. Antonio notes getting out of breath sooner with activity than Antonio used to. This has not gotten worse recently. Antonio Hanson denies shortness of breath at rest or orthopnea.  3. Type 2 diabetes mellitus with hyperglycemia, without long-term current use of insulin (Antonio Hanson) Antonio Hanson's last A1c was 6.7 in April 2021. Antonio is not on medications and was diagnosed initially in April.  4. Other hyperlipidemia associated with type 2 diabetes mellitus Antonio Hanson is not on statin, and his last FLP in April 2021 showed, triglycerides 123, HDL 49, and LDL 116.  5. Essential hypertension associated with type 2 diabetes mellitus Antonio Hanson's blood pressure is well controlled today. Her was diagnosed 7 years ago. Antonio is on lisinopril 20 mg daily.  6. OSA (obstructive sleep apnea) Antonio Hanson uses CPAP, and Antonio is very compliant.  7. NAFLD (nonalcoholic fatty liver disease) Antonio Hanson has a historical diagnosis.  Last LFTs showed, AST 46, ALT 84, and alk phos 87. Ultrasound was done in 2017, showing hepatic steatosis.   8. At risk for heart disease Antonio Hanson is at a higher than average risk for cardiovascular disease due to obesity.   Assessment/Plan:   1. Other fatigue Antonio Hanson that his weight is causing his energy to be lower than it should be.  Fatigue may be related to obesity, depression or many other causes. Labs will be ordered, and in the meanwhile, Antonio Hanson will focus on self care including making healthy food choices, increasing physical activity and focusing on stress reduction.  - EKG 12-Lead - CBC with Differential/Platelet - VITAMIN D 25 Hydroxy (Vit-D Deficiency, Fractures) - Vitamin B12 - Folate - T3 - T4 - TSH  2. SOB (shortness of breath) on exertion Antonio Hanson does Hanson that Antonio gets out of breath more easily that Antonio used to when Antonio exercises. Antonio Hanson's shortness of breath appears to be obesity related and exercise induced. Antonio has agreed to work on weight loss and gradually increase exercise to treat his exercise induced shortness of breath. Will Hanson to monitor closely.  - CBC with Differential/Platelet - VITAMIN D 25 Hydroxy (Vit-D Deficiency, Fractures) - Vitamin B12 - Folate - T3 - T4 - TSH  3. Type 2 diabetes mellitus with hyperglycemia, without long-term current use of insulin ([name]) Good blood sugar control is important to decrease the likelihood of diabetic complications such as nephropathy, neuropathy, limb loss, blindness, coronary artery disease, and death. Intensive lifestyle modification including diet, exercise and weight loss are the first line of treatment for diabetes.   - Hemoglobin A1c - Insulin, random - Microalbumin / creatinine urine ratio  4. Other hyperlipidemia associated with type 2 diabetes mellitus Cardiovascular risk and specific lipid/LDL goals reviewed. We discussed several lifestyle modifications today. Antonio Hanson to work on diet, exercise and weight loss efforts. We will check labs today. Orders and follow up as documented in patient record.   Counseling Intensive lifestyle modifications are the first line treatment for this issue. . Dietary changes: Increase soluble fiber. Decrease simple carbohydrates. . Exercise changes: Moderate to vigorous-intensity aerobic activity  150 minutes per week if tolerated. . Lipid-lowering medications: see documented in medical record.  - Lipid Panel With LDL/HDL Ratio  5. Essential hypertension associated with type 2 diabetes mellitus Antonio Hanson is working on healthy weight loss and exercise to improve blood pressure control. We will watch for signs of hypotension as Antonio continues his lifestyle modifications. We will check labs today.  - EKG 12-Lead - Comprehensive metabolic panel  6. OSA (obstructive sleep apnea) Intensive lifestyle modifications are the first line treatment for this issue. We discussed several lifestyle modifications today. Antonio Hanson Hanson to work on diet, exercise and weight loss efforts. We will Hanson to monitor. Antonio will Hanson to follow up with his sleep doctor. Orders and follow up as documented in patient record.   Counseling  Sleep apnea is a condition in which breathing pauses or becomes shallow during sleep. This happens over and over during the Hanson. This disrupts your sleep and keeps your body from getting the rest that it needs, which can cause tiredness and lack of energy (fatigue) during the day.  Sleep apnea treatment: If you were given a device to open your airway while you sleep, USE IT!  Sleep hygiene:   Limit or avoid alcohol, caffeinated beverages, and cigarettes, especially close to bedtime.   Do not eat a large meal or eat spicy  foods right before bedtime. This can lead to digestive discomfort that can make it hard for you to sleep.  Keep a sleep diary to help you and your health care provider figure out what could be causing your insomnia.  . Make your bedroom a dark, comfortable place where it is easy to fall asleep. ? Put up shades or blackout curtains to block light from outside. ? Use a white noise machine to block noise. ? Keep the temperature cool. . Limit screen use before bedtime. This includes: ? Watching TV. ? Using your smartphone, tablet, or computer. . Stick to a  routine that includes going to bed and waking up at the same times every day and Hanson. This can help you fall asleep faster. Consider making a quiet activity, such as reading, part of your nighttime routine. . Try to avoid taking naps during the day so that you sleep better at Hanson. . Get out of bed if you are still awake after 15 minutes of trying to sleep. Keep the lights down, but try reading or doing a quiet activity. When you Hanson sleepy, go back to bed.  7. NAFLD (nonalcoholic fatty liver disease) We discussed the likely diagnosis of non-alcoholic fatty liver disease today and how this condition is obesity related. Blas was educated the importance of weight loss. Antonio Hanson agreed to Hanson with his weight loss efforts with healthier diet and exercise as an essential part of his treatment plan. We will check labs today.  - Comprehensive metabolic panel  8. Depression screening Antonio Hanson had a positive depression screening. Depression is commonly associated with obesity and often results in emotional eating behaviors. We will monitor this closely and work on CBT to help improve the non-hunger eating patterns. Referral to Psychology may be required if no improvement is seen as Antonio continues in our clinic.  9. At risk for heart disease Antonio Hanson was given approximately 15 minutes of coronary artery disease prevention counseling today. Antonio is 54 y.o. male and has risk factors for heart disease including obesity. We discussed intensive lifestyle modifications today with an emphasis on specific weight loss instructions and strategies.   Repetitive spaced learning was employed today to elicit superior memory formation and behavioral change.  10. Class 3 severe obesity with serious comorbidity and body mass index (BMI) of 40.0 to 44.9 in adult, unspecified obesity type ([name]) Antonio Hanson is currently in the action stage of change and his goal is to Hanson with weight loss efforts. I recommend Tag begin the  structured treatment plan as follows:  Antonio has agreed to the Category 4 Plan with 8 oz of meat at dinner.  Exercise goals: All adults should avoid inactivity. Some physical activity is better than none, and adults who participate in any amount of physical activity gain some health benefits.   Behavioral modification strategies: increasing lean protein intake, increasing vegetables, meal planning and cooking strategies, keeping healthy foods in the home and planning for success.  Antonio was informed of the importance of frequent follow-up visits to maximize his success with intensive lifestyle modifications for his multiple health conditions. Antonio was informed we would discuss his lab results at his next visit unless there is a critical issue that needs to be addressed sooner. Khayden agreed to keep his next visit at the agreed upon time to discuss these results.  Objective:   Blood pressure 120/81, pulse 82, temperature 98 F (36.7 C), temperature source Oral, height '5\' 7"'  (1.702 m), weight 263 lb (119.3 kg), SpO2  97 %. Body mass index is 41.19 kg/m.  EKG: Normal sinus rhythm, rate 82 BPM.  Indirect Calorimeter completed today shows a VO2 of 275 and a REE of 1916.  His calculated basal metabolic rate is 6160 thus his basal metabolic rate is worse than expected.  General: Cooperative, alert, well developed, in no acute distress. HEENT: Conjunctivae and lids unremarkable. Cardiovascular: Regular rhythm.  Lungs: Normal work of breathing. Neurologic: No focal deficits.   Lab Results  Component Value Date   CREATININE 0.81 10/17/2019   BUN 8 10/17/2019   NA 139 10/17/2019   K 4.8 10/17/2019   CL 101 10/17/2019   CO2 24 10/17/2019   Lab Results  Component Value Date   ALT 100 (H) 10/17/2019   AST 61 (H) 10/17/2019   ALKPHOS 105 10/17/2019   BILITOT 0.7 10/17/2019   Lab Results  Component Value Date   HGBA1C 6.7 (H) 10/17/2019   Lab Results  Component Value Date   INSULIN 28.5 (H)  10/17/2019   Lab Results  Component Value Date   TSH 1.730 10/17/2019   Lab Results  Component Value Date   CHOL 194 10/17/2019   HDL 42 10/17/2019   LDLCALC 128 (H) 10/17/2019   TRIG 132 10/17/2019   Lab Results  Component Value Date   WBC 7.9 10/17/2019   HGB 18.2 (H) 10/17/2019   HCT 53.0 (H) 10/17/2019   MCV 88 10/17/2019   PLT 219 10/17/2019   No results found for: IRON, TIBC, FERRITIN  Attestation Statements:   Reviewed by clinician on day of visit: allergies, medications, problem list, medical history, surgical history, family history, social history, and previous encounter notes.   I, Trixie Dredge, am acting as transcriptionist for Coralie Common, MD.  This is the patient's first visit at [facility]. The patient's NEW PATIENT PACKET was reviewed at length. Included in the packet: current and past health history, medications, allergies, ROS, gynecologic history (women only), surgical history, family history, social history, weight history, weight loss surgery history (for those that have had weight loss surgery), nutritional evaluation, mood and food questionnaire, PHQ9, Epworth questionnaire, sleep habits questionnaire, patient life and health improvement goals questionnaire. These will all be scanned into the patient's chart under media.   During the visit, I independently reviewed the patient's EKG, bioimpedance scale results, and indirect calorimeter results. I used this information to tailor a meal plan for the patient that will help him to lose weight and will improve his obesity-related conditions going forward. I performed a medically necessary appropriate examination and/or evaluation. I discussed the assessment and treatment plan with the patient. The patient was provided an opportunity to ask questions and all were answered. The patient agreed with the plan and demonstrated an understanding of the instructions. Labs were ordered at this visit and  will be reviewed at the next visit unless more critical results need to be addressed immediately. Clinical information was updated and documented in the EMR.   Time spent on visit including pre-visit chart review and post-visit care was 45 minutes.   A separate 15 minutes was spent on risk counseling (see above).  I have reviewed the above documentation for accuracy and completeness, and I agree with the above. - Jinny Blossom, MD

## 2019-10-24 ENCOUNTER — Encounter (INDEPENDENT_AMBULATORY_CARE_PROVIDER_SITE_OTHER): Payer: Self-pay | Admitting: Family Medicine

## 2019-10-24 NOTE — Telephone Encounter (Signed)
Please advise 

## 2019-10-25 ENCOUNTER — Other Ambulatory Visit: Payer: Self-pay | Admitting: Family Medicine

## 2019-10-25 ENCOUNTER — Ambulatory Visit
Admission: RE | Admit: 2019-10-25 | Discharge: 2019-10-25 | Disposition: A | Discharge status: Home / Self Care | Payer: Commercial Managed Care - PPO | Source: Ambulatory Visit | Attending: Family Medicine | Admitting: Family Medicine

## 2019-10-25 DIAGNOSIS — M79604 Pain in right leg: Secondary | ICD-10-CM

## 2019-10-31 ENCOUNTER — Ambulatory Visit (INDEPENDENT_AMBULATORY_CARE_PROVIDER_SITE_OTHER): Payer: Commercial Managed Care - PPO | Admitting: Family Medicine

## 2019-10-31 ENCOUNTER — Other Ambulatory Visit: Payer: Self-pay

## 2019-10-31 ENCOUNTER — Encounter (INDEPENDENT_AMBULATORY_CARE_PROVIDER_SITE_OTHER): Payer: Self-pay | Admitting: Family Medicine

## 2019-10-31 VITALS — BP 121/80 | HR 78 | Temp 98.2°F | Ht 67.0 in | Wt 256.0 lb

## 2019-10-31 DIAGNOSIS — E559 Vitamin D deficiency, unspecified: Secondary | ICD-10-CM

## 2019-10-31 DIAGNOSIS — E1165 Type 2 diabetes mellitus with hyperglycemia: Secondary | ICD-10-CM

## 2019-10-31 DIAGNOSIS — R7401 Elevation of levels of liver transaminase levels: Secondary | ICD-10-CM

## 2019-10-31 DIAGNOSIS — E7849 Other hyperlipidemia: Secondary | ICD-10-CM

## 2019-10-31 DIAGNOSIS — Z9189 Other specified personal risk factors, not elsewhere classified: Secondary | ICD-10-CM

## 2019-10-31 DIAGNOSIS — Z6841 Body Mass Index (BMI) 40.0 and over, adult: Secondary | ICD-10-CM

## 2019-10-31 MED ORDER — VITAMIN D (ERGOCALCIFEROL) 1.25 MG (50000 UNIT) PO CAPS: 50000.0000 [IU] | ORAL_CAPSULE | ORAL | 0 refills | Status: DC

## 2019-10-31 NOTE — Progress Notes (Signed)
Chief Complaint:   OBESITY Antonio Hanson is here to discuss his progress with his obesity treatment plan along with follow-up of his obesity related diagnoses. Antonio Hanson is on the Category 4 Plan with 8 oz of meat and states he is following his eating plan approximately 95% of the time. Antonio Hanson states he is walking and jogging 10,000 steps 7 times per week.  Today's visit was #: 2 Starting weight: 263 lbs Starting date: 10/17/2019 Today's weight: 256 lbs Today's date: 10/31/2019 Total lbs lost to date: 7 Total lbs lost since last in-office visit: 7  Interim History: Antonio Hanson feels disappointed with only 7 lbs weight loss. He states he lost 7 lbs the first week and hasn't lost anything since. He doesn't find the meal plan hard to follow. He is going on vacation the third week of September.  Subjective:   1. Transaminitis Rece's ALT is 100, AST 61, and Alk phos 105. His abdominal ultrasound in 2017 was showing hepatic steatosis. I discussed labs with the patient today.  2. Type 2 diabetes mellitus with hyperglycemia, without long-term current use of insulin ([name]) Maven's last A1c was 6.7 and insulin 28.5. He is not on medications, and goal is to be off medication. I discussed labs with the patient today.  3. Vitamin D deficiency Antonio Hanson is not on Vit D, and he notes fatigue. Last Vit D level was 37.3. I discussed labs with the patient today.  4. Other hyperlipidemia Antonio Hanson's HDL is 42, LDL 128, and triglycerides 132. He is not on statin. His 10 year ASCVD risk score is 12.1%, mod intensity statin indicated. I discussed labs with the patient today.  5. At risk for osteoporosis Antonio Hanson is at higher risk of osteopenia and osteoporosis due to Vitamin D deficiency.   Assessment/Plan:   1. Transaminitis We will repeat CMP in 3 months, and Orlie will continue to follow up as directed.  2. Type 2 diabetes mellitus with hyperglycemia, without long-term current use of insulin ([name]) Good blood sugar  control is important to decrease the likelihood of diabetic complications such as nephropathy, neuropathy, limb loss, blindness, coronary artery disease, and death. Intensive lifestyle modification including diet, exercise and weight loss are the first line of treatment for diabetes. We will repeat labs in 3 months, no medications at this time. Judith will continue to follow up as directed.  3. Vitamin D deficiency Low Vitamin D level contributes to fatigue and are associated with obesity, breast, and colon cancer. Vernel agreed to start prescription Vitamin D 50,000 IU every week with no refills. He will follow-up for routine testing of Vitamin D, at least 2-3 times per year to avoid over-replacement.  - Vitamin D, Ergocalciferol, (DRISDOL) 1.25 MG (50000 UNIT) CAPS capsule; Take 1 capsule (50,000 Units total) by mouth every 7 (seven) days.  Dispense: 4 capsule; Refill: 0  4. Other hyperlipidemia Cardiovascular risk and specific lipid/LDL goals reviewed. We discussed several lifestyle modifications today and [place] will continue to work on diet, exercise and weight loss efforts. We will repeat labs in 3 months. If goal of LDL <70 not met then we will revisit statin. Orders and follow up as documented in patient record.   Counseling Intensive lifestyle modifications are the first line treatment for this issue. . Dietary changes: Increase soluble fiber. Decrease simple carbohydrates. . Exercise changes: Moderate to vigorous-intensity aerobic activity 150 minutes per week if tolerated. . Lipid-lowering medications: see documented in medical record.  5. At risk for osteoporosis Antonio Hanson was given  approximately 30 minutes of osteoporosis prevention counseling today. Antonio Hanson is at risk for osteopenia and osteoporosis due to his Vitamin D deficiency. He was encouraged to take his Vitamin D and follow his higher calcium diet and increase strengthening exercise to help strengthen his bones and decrease his risk of  osteopenia and osteoporosis.  Repetitive spaced learning was employed today to elicit superior memory formation and behavioral change.  6. Class 3 severe obesity with serious comorbidity and body mass index (BMI) of 40.0 to 44.9 in adult, unspecified obesity type ([name]) Antonio Hanson is currently in the action stage of change. As such, his goal is to continue with weight loss efforts. He has agreed to the Category 4 Plan with 8 oz of meat.   Exercise goals: All adults should avoid inactivity. Some physical activity is better than none, and adults who participate in any amount of physical activity gain some health benefits.  Behavioral modification strategies: increasing lean protein intake, increasing vegetables, meal planning and cooking strategies, keeping healthy foods in the home and planning for success.  Antonio Hanson has agreed to follow-up with our clinic in 2 weeks. He was informed of the importance of frequent follow-up visits to maximize his success with intensive lifestyle modifications for his multiple health conditions.   Objective:   Blood pressure 121/80, pulse 78, temperature 98.2 F (36.8 C), temperature source Oral, height _0  (1.702 m), weight 256 lb (116.1 kg), SpO2 97 %. Body mass index is 40.1 kg/m.  General: Cooperative, alert, well developed, in no acute distress. HEENT: Conjunctivae and lids unremarkable. Cardiovascular: Regular rhythm.  Lungs: Normal work of breathing. Neurologic: No focal deficits.   Lab Results  Component Value Date   CREATININE 0.81 10/17/2019   BUN 8 10/17/2019   NA 139 10/17/2019   K 4.8 10/17/2019   CL 101 10/17/2019   CO2 24 10/17/2019   Lab Results  Component Value Date   ALT 100 (H) 10/17/2019   AST 61 (H) 10/17/2019   ALKPHOS 105 10/17/2019   BILITOT 0.7 10/17/2019   Lab Results  Component Value Date   HGBA1C 6.7 (H) 10/17/2019   Lab Results  Component Value Date   INSULIN 28.5 (H) 10/17/2019   Lab Results  Component Value Date    TSH 1.730 10/17/2019   Lab Results  Component Value Date   CHOL 194 10/17/2019   HDL 42 10/17/2019   LDLCALC 128 (H) 10/17/2019   TRIG 132 10/17/2019   Lab Results  Component Value Date   WBC 7.9 10/17/2019   HGB 18.2 (H) 10/17/2019   HCT 53.0 (H) 10/17/2019   MCV 88 10/17/2019   PLT 219 10/17/2019   No results found for: IRON, TIBC, FERRITIN  Attestation Statements:   Reviewed by clinician on day of visit: allergies, medications, problem list, medical history, surgical history, family history, social history, and previous encounter notes.   I, Trixie Dredge, am acting as transcriptionist for Coralie Common, MD.  I have reviewed the above documentation for accuracy and completeness, and I agree with the above. - Jinny Blossom, MD

## 2019-11-11 ENCOUNTER — Encounter (INDEPENDENT_AMBULATORY_CARE_PROVIDER_SITE_OTHER): Payer: Self-pay | Admitting: Family Medicine

## 2019-11-15 ENCOUNTER — Encounter (INDEPENDENT_AMBULATORY_CARE_PROVIDER_SITE_OTHER): Payer: Self-pay | Admitting: Family Medicine

## 2019-11-15 ENCOUNTER — Other Ambulatory Visit: Payer: Self-pay

## 2019-11-15 ENCOUNTER — Ambulatory Visit (INDEPENDENT_AMBULATORY_CARE_PROVIDER_SITE_OTHER): Payer: Commercial Managed Care - PPO | Admitting: Family Medicine

## 2019-11-15 VITALS — BP 113/73 | HR 72 | Temp 98.1°F | Ht 67.0 in | Wt 248.0 lb

## 2019-11-15 DIAGNOSIS — Z6838 Body mass index (BMI) 38.0-38.9, adult: Secondary | ICD-10-CM

## 2019-11-15 DIAGNOSIS — E559 Vitamin D deficiency, unspecified: Secondary | ICD-10-CM

## 2019-11-15 DIAGNOSIS — E1165 Type 2 diabetes mellitus with hyperglycemia: Secondary | ICD-10-CM | POA: Diagnosis not present

## 2019-11-15 DIAGNOSIS — Z9189 Other specified personal risk factors, not elsewhere classified: Secondary | ICD-10-CM | POA: Diagnosis not present

## 2019-11-15 DIAGNOSIS — E1169 Type 2 diabetes mellitus with other specified complication: Secondary | ICD-10-CM | POA: Diagnosis not present

## 2019-11-15 DIAGNOSIS — E785 Hyperlipidemia, unspecified: Secondary | ICD-10-CM

## 2019-11-15 MED ORDER — VITAMIN D (ERGOCALCIFEROL) 1.25 MG (50000 UNIT) PO CAPS: 50000.0000 [IU] | ORAL_CAPSULE | ORAL | 0 refills | Status: DC

## 2019-11-15 NOTE — Progress Notes (Signed)
Chief Complaint:   OBESITY Antonio Hanson is here to discuss his progress with his obesity treatment plan along with follow-up of his obesity related diagnoses. Antonio Hanson is on the Category 4 Plan with 8 oz of meat at dinner and states he is following his eating plan approximately 95-98% of the time. Antonio Hanson states he is walking 10,000-14,000 steps 7 times per week, and upper body exercise 3-4 times per week.  Today's visit was #: 3 Starting weight: 263 lbs Starting date: 10/17/2019 Today's weight: 248 lbs Today's date: 11/15/2019 Total lbs lost to date: 15 Total lbs lost since last in-office visit: 8  Interim History: Antonio Hanson is getting hungry fairly consistently on Category 4. He is starting to venture out to restaurants and trying to experiment with what he can have on the plan. He has a vacation in 1 week. He is trying to maintain his weight during the next 2 weeks.  Subjective:   1. Vitamin D deficiency Antonio Hanson denies nausea, vomiting, or muscle weakness, but he notes fatigue. He is on prescription Vit D.  2. Type 2 diabetes mellitus with hyperglycemia, without long-term current use of insulin ([name]) Antonio Hanson's last A1c was 6.7 and insulin 28.5. He is not on any medications.  3. Hyperlipidemia associated with type 2 diabetes mellitus ([name]) Antonio Hanson's last LDl was 128, HDL 42, and triglycerides 798. He is not on statin.  4. At risk for heart disease Antonio Hanson is at a higher than average risk for cardiovascular disease due to obesity.   Assessment/Plan:   1. Vitamin D deficiency Low Vitamin D level contributes to fatigue and are associated with obesity, breast, and colon cancer. We will refill prescription Vitamin D for 1 month. Antonio Hanson will follow-up for routine testing of Vitamin D, at least 2-3 times per year to avoid over-replacement.  - Vitamin D, Ergocalciferol, (DRISDOL) 1.25 MG (50000 UNIT) CAPS capsule; Take 1 capsule (50,000 Units total) by mouth every 7 (seven) days.  Dispense: 4 capsule;  Refill: 0  2. Type 2 diabetes mellitus with hyperglycemia, without long-term current use of insulin ([name]) Good blood sugar control is important to decrease the likelihood of diabetic complications such as nephropathy, neuropathy, limb loss, blindness, coronary artery disease, and death. Intensive lifestyle modification including diet, exercise and weight loss are the first line of treatment for diabetes. Antonio Hanson will continue to follow up as directed. We will need to repeat labs in early 2022. We will revisit medications at the time of his next labs.  3. Hyperlipidemia associated with type 2 diabetes mellitus ([name]) Cardiovascular risk and specific lipid/LDL goals reviewed. We discussed several lifestyle modifications today and Antonio Hanson will continue to work on diet, exercise and weight loss efforts. Antonio Hanson would like to hold off on statin at this time. Orders and follow up as documented in patient record.   Counseling Intensive lifestyle modifications are the first line treatment for this issue. . Dietary changes: Increase soluble fiber. Decrease simple carbohydrates. . Exercise changes: Moderate to vigorous-intensity aerobic activity 150 minutes per week if tolerated. . Lipid-lowering medications: see documented in medical record.  4. At risk for heart disease Antonio Hanson was given approximately 15 minutes of coronary artery disease prevention counseling today. He is 54 y.o. male and has risk factors for heart disease including obesity. We discussed intensive lifestyle modifications today with an emphasis on specific weight loss instructions and strategies.   Repetitive spaced learning was employed today to elicit superior memory formation and behavioral change.  5. Class 2 severe obesity  with serious comorbidity and body mass index (BMI) of 38.0 to 38.9 in adult, unspecified obesity type ([name]) Antonio Hanson is currently in the action stage of change. As such, his goal is to continue with weight loss efforts. He  has agreed to the Category 4 Plan with 10 oz of meat at dinner.   Exercise goals: As is.  Behavioral modification strategies: increasing lean protein intake, meal planning and cooking strategies, keeping healthy foods in the home and planning for success.  Antonio Hanson has agreed to follow-up with our clinic in 2 weeks. He was informed of the importance of frequent follow-up visits to maximize his success with intensive lifestyle modifications for his multiple health conditions.   Objective:   Blood pressure 113/73, pulse 72, temperature 98.1 F (36.7 C), temperature source Oral, height 5\' 7"  (1.702 m), weight 248 lb (112.5 kg), SpO2 98 %. Body mass index is 38.84 kg/m.  General: Cooperative, alert, well developed, in no acute distress. HEENT: Conjunctivae and lids unremarkable. Cardiovascular: Regular rhythm.  Lungs: Normal work of breathing. Neurologic: No focal deficits.   Lab Results  Component Value Date   CREATININE 0.81 10/17/2019   BUN 8 10/17/2019   NA 139 10/17/2019   K 4.8 10/17/2019   CL 101 10/17/2019   CO2 24 10/17/2019   Lab Results  Component Value Date   ALT 100 (H) 10/17/2019   AST 61 (H) 10/17/2019   ALKPHOS 105 10/17/2019   BILITOT 0.7 10/17/2019   Lab Results  Component Value Date   HGBA1C 6.7 (H) 10/17/2019   Lab Results  Component Value Date   INSULIN 28.5 (H) 10/17/2019   Lab Results  Component Value Date   TSH 1.730 10/17/2019   Lab Results  Component Value Date   CHOL 194 10/17/2019   HDL 42 10/17/2019   LDLCALC 128 (H) 10/17/2019   TRIG 132 10/17/2019   Lab Results  Component Value Date   WBC 7.9 10/17/2019   HGB 18.2 (H) 10/17/2019   HCT 53.0 (H) 10/17/2019   MCV 88 10/17/2019   PLT 219 10/17/2019   No results found for: IRON, TIBC, FERRITIN  Attestation Statements:   Reviewed by clinician on day of visit: allergies, medications, problem list, medical history, surgical history, family history, social history, and previous  encounter notes.   I, 12/17/2019, am acting as transcriptionist for Burt Knack, MD.  I have reviewed the above documentation for accuracy and completeness, and I agree with the above. - Reuben Likes, MD

## 2019-12-06 ENCOUNTER — Other Ambulatory Visit: Payer: Self-pay

## 2019-12-06 ENCOUNTER — Ambulatory Visit (INDEPENDENT_AMBULATORY_CARE_PROVIDER_SITE_OTHER): Payer: Commercial Managed Care - PPO | Admitting: Family Medicine

## 2019-12-06 ENCOUNTER — Encounter (INDEPENDENT_AMBULATORY_CARE_PROVIDER_SITE_OTHER): Payer: Self-pay | Admitting: Bariatrics

## 2019-12-06 ENCOUNTER — Ambulatory Visit (INDEPENDENT_AMBULATORY_CARE_PROVIDER_SITE_OTHER): Payer: Commercial Managed Care - PPO | Admitting: Bariatrics

## 2019-12-06 VITALS — BP 109/73 | HR 75 | Temp 98.7°F | Ht 67.0 in | Wt 245.0 lb

## 2019-12-06 DIAGNOSIS — I10 Essential (primary) hypertension: Secondary | ICD-10-CM

## 2019-12-06 DIAGNOSIS — K219 Gastro-esophageal reflux disease without esophagitis: Secondary | ICD-10-CM

## 2019-12-06 NOTE — Progress Notes (Signed)
Chief Complaint:   OBESITY Antonio Hanson is here to discuss his progress with his obesity treatment plan along with follow-up of his obesity related diagnoses. Antonio Hanson is on the Category 4 Plan and states he is following his eating plan approximately 50% of the time. Antonio Hanson states he is walking and doing upper body exercises for 45 minutes 6 times per week.  Today's visit was #: 4 Starting weight: 263 lbs Starting date: 10/17/19 Today's weight: 248 lbs Today's date: 12/06/2019 Total lbs lost to date: 18 Total lbs lost since last in-office visit: 3  Interim History: Antonio Hanson is down 3 pounds and has done well overall. He has been on vacation. Antonio Hanson still gets hungry occasional and is eating adequate protein.  Subjective:   1. Essential hypertension Antonio Hanson is taking lisinopril and his blood pressure is controlled.  BP Readings from Last 3 Encounters:  12/06/19 109/73  11/15/19 113/73  10/31/19 121/80   Lab Results  Component Value Date   CREATININE 0.81 10/17/2019   2. Gastroesophageal reflux disease without esophagitis He has been experiencing GERD symptoms in which he is taking Prilosec.  Assessment/Plan:   1. Essential hypertension Antonio Hanson is working on healthy weight loss and exercise to improve blood pressure control. We will watch for signs of hypotension as he continues his lifestyle modifications. Antonio Hanson agrees to continue taking his medications.  2. Gastroesophageal reflux disease without esophagitis Intensive lifestyle modifications are the first line treatment for this issue. We discussed several lifestyle modifications today and he will continue to work on diet, exercise and weight loss efforts. Orders and follow up as documented in patient record. Antonio Hanson agrees to continue taking his medication.  Counseling . If a person has gastroesophageal reflux disease (GERD), food and stomach acid move back up into the esophagus and cause symptoms or problems such as damage to the  esophagus. . Anti-reflux measures include: raising the head of the bed, avoiding tight clothing or belts, avoiding eating late at night, not lying down shortly after mealtime, and achieving weight loss. . Avoid ASA, NSAID's, caffeine, alcohol, and tobacco.  . OTC Pepcid and/or Tums are often very helpful for as needed use.  Marland Kitchen However, for persisting chronic or daily symptoms, stronger medications like Omeprazole may be needed. . You may need to avoid foods and drinks such as: ? Coffee and tea (with or without caffeine). ? Drinks that contain alcohol. ? Energy drinks and sports drinks. ? Bubbly (carbonated) drinks or sodas. ? Chocolate and cocoa. ? Peppermint and mint flavorings. ? Garlic and onions. ? Horseradish. ? Spicy and acidic foods. These include peppers, chili powder, curry powder, vinegar, hot sauces, and BBQ sauce. ? Citrus fruit juices and citrus fruits, such as oranges, lemons, and limes. ? Tomato-based foods. These include red sauce, chili, salsa, and pizza with red sauce. ? Fried and fatty foods. These include donuts, french fries, potato chips, and high-fat dressings. ? High-fat meats. These include hot dogs, rib eye steak, sausage, ham, and bacon.  3. Class 2 severe obesity with serious comorbidity and body mass index (BMI) of 38.0 to 38.9 in adult, unspecified obesity type ([name])  Antonio Hanson is currently in the action stage of change. As such, his goal is to continue with weight loss efforts. He has agreed to the Category 4 Plan with meal planning. Antonio Hanson agrees to mindful eating and increasing water intake and protein.    Exercise goals: Continue walking and doing upper body exercises.  Behavioral modification strategies: increasing lean protein intake,  decreasing simple carbohydrates, increasing vegetables, increasing water intake, decreasing eating out, no skipping meals, meal planning and cooking strategies, keeping healthy foods in the home and planning for success.  Antonio Hanson  has agreed to follow-up with our clinic in 2 to 3 weeks. He was informed of the importance of frequent follow-up visits to maximize his success with intensive lifestyle modifications for his multiple health conditions.   Objective:   Blood pressure 109/73, pulse 75, temperature 98.7 F (37.1 C), height 5\' 7"  (1.702 m), weight 245 lb (111.1 kg), SpO2 96 %. Body mass index is 38.37 kg/m.  General: Cooperative, alert, well developed, in no acute distress. HEENT: Conjunctivae and lids unremarkable. Cardiovascular: Regular rhythm.  Lungs: Normal work of breathing. Neurologic: No focal deficits.   Lab Results  Component Value Date   CREATININE 0.81 10/17/2019   BUN 8 10/17/2019   NA 139 10/17/2019   K 4.8 10/17/2019   CL 101 10/17/2019   CO2 24 10/17/2019   Lab Results  Component Value Date   ALT 100 (H) 10/17/2019   AST 61 (H) 10/17/2019   ALKPHOS 105 10/17/2019   BILITOT 0.7 10/17/2019   Lab Results  Component Value Date   HGBA1C 6.7 (H) 10/17/2019   Lab Results  Component Value Date   INSULIN 28.5 (H) 10/17/2019   Lab Results  Component Value Date   TSH 1.730 10/17/2019   Lab Results  Component Value Date   CHOL 194 10/17/2019   HDL 42 10/17/2019   LDLCALC 128 (H) 10/17/2019   TRIG 132 10/17/2019   Lab Results  Component Value Date   WBC 7.9 10/17/2019   HGB 18.2 (H) 10/17/2019   HCT 53.0 (H) 10/17/2019   MCV 88 10/17/2019   PLT 219 10/17/2019   No results found for: IRON, TIBC, FERRITIN  Attestation Statements:   Reviewed by clinician on day of visit: allergies, medications, problem list, medical history, surgical history, family history, social history, and previous encounter notes.  Time spent on visit including pre-visit chart review and post-visit care and charting was 20 minutes.   I, 12/17/2019, CMA, am acting as Kirke Corin for Energy manager. [employer], DO  I have reviewed the above documentation for accuracy and completeness, and I agree with the  above. Manson Passey, DO

## 2019-12-20 ENCOUNTER — Ambulatory Visit (INDEPENDENT_AMBULATORY_CARE_PROVIDER_SITE_OTHER): Payer: Commercial Managed Care - PPO | Admitting: Family Medicine

## 2019-12-20 ENCOUNTER — Encounter (INDEPENDENT_AMBULATORY_CARE_PROVIDER_SITE_OTHER): Payer: Self-pay | Admitting: Family Medicine

## 2019-12-20 ENCOUNTER — Other Ambulatory Visit: Payer: Self-pay

## 2019-12-20 VITALS — BP 113/75 | HR 82 | Temp 98.5°F | Ht 67.0 in | Wt 245.0 lb

## 2019-12-20 DIAGNOSIS — E559 Vitamin D deficiency, unspecified: Secondary | ICD-10-CM

## 2019-12-20 DIAGNOSIS — Z9189 Other specified personal risk factors, not elsewhere classified: Secondary | ICD-10-CM

## 2019-12-20 DIAGNOSIS — I10 Essential (primary) hypertension: Secondary | ICD-10-CM | POA: Diagnosis not present

## 2019-12-20 DIAGNOSIS — Z6838 Body mass index (BMI) 38.0-38.9, adult: Secondary | ICD-10-CM

## 2019-12-20 MED ORDER — VITAMIN D (ERGOCALCIFEROL) 1.25 MG (50000 UNIT) PO CAPS: 50000.0000 [IU] | ORAL_CAPSULE | ORAL | 0 refills | Status: DC

## 2019-12-25 NOTE — Progress Notes (Signed)
Chief Complaint:   OBESITY Antonio Hanson is here to discuss his progress with his obesity treatment plan along with follow-up of his obesity related diagnoses. Antonio Hanson is on the Category 4 Plan and states he is following his eating plan approximately 70-75% of the time. Antonio Hanson states he is walking 10,000 steps per day and doing occasional upper body exercises.  Today's visit was #: 5 Starting weight: 263 lbs Starting date: 10/17/2019 Today's weight: 245 lbs Today's date: 12/20/2019 Total lbs lost to date: 18 lbs Total lbs lost since last in-office visit: 3 lbs  Interim History: Antonio Hanson says that over the last few weeks he has been following the plan 70-75% of the time.  He found himself eating peanuts and pork sausage.  He reports that he has slacked on keeping track of snack calories and logging food.  He has also increased his alcohol consumption.  Subjective:   1. Vitamin D deficiency Antonio Hanson's Vitamin D level was 37.3 on 10/17/2019. He is currently taking prescription vitamin D 50,000 IU each week. He denies nausea, vomiting or muscle weakness.  He endorses fatigue.  2. Essential hypertension Review: taking medications as instructed, no medication side effects noted, no chest pain on exertion, no dyspnea on exertion, no swelling of ankles, no headache.  Blood pressure is controlled today.  He is taking lisinopril daily.  BP Readings from Last 3 Encounters:  12/20/19 113/75  12/06/19 109/73  11/15/19 113/73   3. At risk for osteoporosis Antonio Hanson is at higher risk of osteopenia and osteoporosis due to Vitamin D deficiency.   Assessment/Plan:   1. Vitamin D deficiency Low Vitamin D level contributes to fatigue and are associated with obesity, breast, and colon cancer. He agrees to continue to take prescription Vitamin D @50 ,000 IU every week and will follow-up for routine testing of Vitamin D, at least 2-3 times per year to avoid over-replacement.    -Refill Vitamin D, Ergocalciferol,  (DRISDOL) 1.25 MG (50000 UNIT) CAPS capsule; Take 1 capsule (50,000 Units total) by mouth every 7 (seven) days.  Dispense: 4 capsule; Refill: 0  2. Essential hypertension [name] is working on healthy weight loss and exercise to improve blood pressure control. We will watch for signs of hypotension as he continues his lifestyle modifications.  Continue lisinopril 20 mg daily.  3. At risk for osteoporosis Antonio Hanson was given approximately 15 minutes of osteoporosis prevention counseling today. Antonio Hanson is at risk for osteopenia and osteoporosis due to his Vitamin D deficiency. He was encouraged to take his Vitamin D and follow his higher calcium diet and increase strengthening exercise to help strengthen his bones and decrease his risk of osteopenia and osteoporosis.  Repetitive spaced learning was employed today to elicit superior memory formation and behavioral change.  4. Class 2 severe obesity with serious comorbidity and body mass index (BMI) of 38.0 to 38.9 in adult, unspecified obesity type ([name])  [name] is currently in the action stage of change. As such, his goal is to continue with weight loss efforts. He has agreed to the Category 4 Plan.   Exercise goals: All adults should avoid inactivity. Some physical activity is better than none, and adults who participate in any amount of physical activity gain some health benefits.  Behavioral modification strategies: increasing lean protein intake, meal planning and cooking strategies, keeping healthy foods in the home and planning for success.  Antonio Hanson has agreed to follow-up with our clinic in 3 weeks. He was informed of the importance of frequent follow-up visits  to maximize his success with intensive lifestyle modifications for his multiple health conditions.   Objective:   Blood pressure 113/75, pulse 82, temperature 98.5 F (36.9 C), temperature source Oral, height 5\' 7"  (1.702 m), weight 245 lb (111.1 kg), SpO2 97 %. Body mass index is 38.37  kg/m.  General: Cooperative, alert, well developed, in no acute distress. HEENT: Conjunctivae and lids unremarkable. Cardiovascular: Regular rhythm.  Lungs: Normal work of breathing. Neurologic: No focal deficits.   Lab Results  Component Value Date   CREATININE 0.81 10/17/2019   BUN 8 10/17/2019   NA 139 10/17/2019   K 4.8 10/17/2019   CL 101 10/17/2019   CO2 24 10/17/2019   Lab Results  Component Value Date   ALT 100 (H) 10/17/2019   AST 61 (H) 10/17/2019   ALKPHOS 105 10/17/2019   BILITOT 0.7 10/17/2019   Lab Results  Component Value Date   HGBA1C 6.7 (H) 10/17/2019   Lab Results  Component Value Date   INSULIN 28.5 (H) 10/17/2019   Lab Results  Component Value Date   TSH 1.730 10/17/2019   Lab Results  Component Value Date   CHOL 194 10/17/2019   HDL 42 10/17/2019   LDLCALC 128 (H) 10/17/2019   TRIG 132 10/17/2019   Lab Results  Component Value Date   WBC 7.9 10/17/2019   HGB 18.2 (H) 10/17/2019   HCT 53.0 (H) 10/17/2019   MCV 88 10/17/2019   PLT 219 10/17/2019   Attestation Statements:   Reviewed by clinician on day of visit: allergies, medications, problem list, medical history, surgical history, family history, social history, and previous encounter notes.  I, 12/17/2019, CMA, am acting as transcriptionist for Insurance claims handler, MD.  I have reviewed the above documentation for accuracy and completeness, and I agree with the above. - Reuben Likes, MD

## 2020-01-08 LAB — LIPID PANEL
Cholesterol: 167 (ref 0–200)
HDL: 43 (ref 35–70)
LDL Cholesterol: 100
Triglycerides: 131 (ref 40–160)

## 2020-01-08 LAB — HEPATIC FUNCTION PANEL
ALT: 33 (ref 10–40)
AST: 20 (ref 14–40)
Bilirubin, Total: 0.8

## 2020-01-08 LAB — COMPREHENSIVE METABOLIC PANEL
Albumin: 4.7 (ref 3.5–5.0)
Calcium: 10.4 (ref 8.7–10.7)
GFR calc Af Amer: 125
GFR calc non Af Amer: 103

## 2020-01-08 LAB — CBC AND DIFFERENTIAL
HCT: 52 (ref 41–53)
Hemoglobin: 17.3 (ref 13.5–17.5)
WBC: 9.6

## 2020-01-08 LAB — CBC: RBC: 5.99 — AB (ref 3.87–5.11)

## 2020-01-08 LAB — BASIC METABOLIC PANEL
BUN: 19 (ref 4–21)
CO2: 29 — AB (ref 13–22)
Chloride: 103 (ref 99–108)
Creatinine: 0.8 (ref <=1.3)
Glucose: 86
Potassium: 4.5 (ref 3.4–5.3)
Sodium: 139 (ref 137–147)

## 2020-01-08 LAB — IRON,TIBC AND FERRITIN PANEL: Ferritin: 104.3

## 2020-01-08 LAB — HEMOGLOBIN A1C: Hemoglobin A1C: 5.9

## 2020-01-10 ENCOUNTER — Encounter (INDEPENDENT_AMBULATORY_CARE_PROVIDER_SITE_OTHER): Payer: Self-pay | Admitting: Family Medicine

## 2020-01-10 ENCOUNTER — Ambulatory Visit (INDEPENDENT_AMBULATORY_CARE_PROVIDER_SITE_OTHER): Payer: Commercial Managed Care - PPO | Admitting: Family Medicine

## 2020-01-10 ENCOUNTER — Other Ambulatory Visit: Payer: Self-pay

## 2020-01-10 VITALS — BP 115/77 | HR 86 | Temp 98.3°F | Ht 67.0 in | Wt 244.0 lb

## 2020-01-10 DIAGNOSIS — Z9189 Other specified personal risk factors, not elsewhere classified: Secondary | ICD-10-CM | POA: Diagnosis not present

## 2020-01-10 DIAGNOSIS — E559 Vitamin D deficiency, unspecified: Secondary | ICD-10-CM

## 2020-01-10 DIAGNOSIS — E785 Hyperlipidemia, unspecified: Secondary | ICD-10-CM

## 2020-01-10 DIAGNOSIS — Z6838 Body mass index (BMI) 38.0-38.9, adult: Secondary | ICD-10-CM

## 2020-01-10 DIAGNOSIS — E1165 Type 2 diabetes mellitus with hyperglycemia: Secondary | ICD-10-CM | POA: Diagnosis not present

## 2020-01-10 DIAGNOSIS — R7401 Elevation of levels of liver transaminase levels: Secondary | ICD-10-CM

## 2020-01-10 DIAGNOSIS — E1169 Type 2 diabetes mellitus with other specified complication: Secondary | ICD-10-CM | POA: Diagnosis not present

## 2020-01-10 MED ORDER — VITAMIN D (ERGOCALCIFEROL) 1.25 MG (50000 UNIT) PO CAPS: 50000.0000 [IU] | ORAL_CAPSULE | ORAL | 0 refills | Status: DC

## 2020-01-14 NOTE — Progress Notes (Signed)
Chief Complaint:   OBESITY Antonio Hanson is here to discuss his progress with his obesity treatment plan along with follow-up of his obesity related diagnoses. Antonio Hanson is on the Category 4 Plan and states he is following his eating plan approximately 80-85% of the time. Antonio Hanson states he is walking 12,000-14,000 steps 7 times per week.  Today's visit was #: 6 Starting weight: 263 lbs Starting date: 10/17/2019 Today's weight: 244 lbs Today's date: 01/10/2020 Total lbs lost to date: 19 Total lbs lost since last in-office visit: 1  Interim History: Antonio Hanson has been working more on his deck and getting more steps in. Quite a few of his labs values have improved. He feels like he has hit a plateau with his weight loss. He is getting in all of the food, but getting hungry. He is getting in 10 oz of meat at night.  Subjective:   1. Vitamin D deficiency Antonio Hanson denies nausea, vomiting, or muscle weakness, but he notes fatigue. He is on prescription Vit D. His last Vit D level was 37.3.  2. Type 2 diabetes mellitus with hyperglycemia, without long-term current use of insulin ([name]) Antonio Hanson's last A1c was 5.9 and insulin 21.1. He is not on metformin.  3. Hyperlipidemia associated with type 2 diabetes mellitus ([place]) Antonio Hanson's last LDL on 01/08/2020 was 100, HDL 43, and triglycerides 131. He was started on statin.  4. Transaminitis Antonio Hanson last labs showed improvement in LFTs. Last AST was 20, ALT 33, and Alk phos 75. Last labs were done on 01/08/2020.  5. At risk for osteoporosis Antonio Hanson is at higher risk of osteopenia and osteoporosis due to Vitamin D deficiency.   Assessment/Plan:   1. Vitamin D deficiency Low Vitamin D level contributes to fatigue and are associated with obesity, breast, and colon cancer. We will refill prescription Vitamin D for 1 month. Antonio Hanson will follow-up for routine testing of Vitamin D, at least 2-3 times per year to avoid over-replacement.  - Vitamin D, Ergocalciferol, (DRISDOL)  1.25 MG (50000 UNIT) CAPS capsule; Take 1 capsule (50,000 Units total) by mouth every 7 (seven) days.  Dispense: 4 capsule; Refill: 0  2. Type 2 diabetes mellitus with hyperglycemia, without long-term current use of insulin ([name]) Good blood sugar control is important to decrease the likelihood of diabetic complications such as nephropathy, neuropathy, limb loss, blindness, coronary artery disease, and death. Intensive lifestyle modification including diet, exercise and weight loss are the first line of treatment for diabetes. We will repeat labs in 3 months.  3. Hyperlipidemia associated with type 2 diabetes mellitus ([place]) Cardiovascular risk and specific lipid/LDL goals reviewed. We discussed several lifestyle modifications today. Antonio Hanson will continue to work on diet, exercise and weight loss efforts. We will repeat labs in 3 months. Orders and follow up as documented in patient record.   Counseling Intensive lifestyle modifications are the first line treatment for this issue. . Dietary changes: Increase soluble fiber. Decrease simple carbohydrates. . Exercise changes: Moderate to vigorous-intensity aerobic activity 150 minutes per week if tolerated. . Lipid-lowering medications: see documented in medical record.  4. Transaminitis We will repeat labs in 3 months, and Debbie will continue to follow up as directed.  5. At risk for osteoporosis Antonio Hanson was given approximately 15 minutes of osteoporosis prevention counseling today. Antonio Hanson is at risk for osteopenia and osteoporosis due to his Vitamin D deficiency. He was encouraged to take his Vitamin D and follow his higher calcium diet and increase strengthening exercise to help strengthen his bones  and decrease his risk of osteopenia and osteoporosis.  Repetitive spaced learning was employed today to elicit superior memory formation and behavioral change.  6. Class 2 severe obesity with serious comorbidity and body mass index (BMI) of 38.0 to 38.9 in  adult, unspecified obesity type ([name]) Antonio Hanson is currently in the action stage of change. As such, his goal is to continue with weight loss efforts. He has agreed to the Category 4 Plan + 200 calories.   Exercise goals: As is.  Behavioral modification strategies: increasing lean protein intake, meal planning and cooking strategies, keeping healthy foods in the home, holiday eating strategies  and planning for success.  Antonio Hanson has agreed to follow-up with our clinic in 3 to 4 weeks. He was informed of the importance of frequent follow-up visits to maximize his success with intensive lifestyle modifications for his multiple health conditions.   Objective:   Blood pressure 115/77, pulse 86, temperature 98.3 F (36.8 C), temperature source Oral, height '5\' 7"'  (1.702 m), weight 244 lb (110.7 kg), SpO2 97 %. Body mass index is 38.22 kg/m.  General: Cooperative, alert, well developed, in no acute distress. HEENT: Conjunctivae and lids unremarkable. Cardiovascular: Regular rhythm.  Lungs: Normal work of breathing. Neurologic: No focal deficits.   Lab Results  Component Value Date   CREATININE 0.81 10/17/2019   BUN 8 10/17/2019   NA 139 10/17/2019   K 4.8 10/17/2019   CL 101 10/17/2019   CO2 24 10/17/2019   Lab Results  Component Value Date   ALT 100 (H) 10/17/2019   AST 61 (H) 10/17/2019   ALKPHOS 105 10/17/2019   BILITOT 0.7 10/17/2019   Lab Results  Component Value Date   HGBA1C 6.7 (H) 10/17/2019   Lab Results  Component Value Date   INSULIN 28.5 (H) 10/17/2019   Lab Results  Component Value Date   TSH 1.730 10/17/2019   Lab Results  Component Value Date   CHOL 194 10/17/2019   HDL 42 10/17/2019   LDLCALC 128 (H) 10/17/2019   TRIG 132 10/17/2019   Lab Results  Component Value Date   WBC 7.9 10/17/2019   HGB 18.2 (H) 10/17/2019   HCT 53.0 (H) 10/17/2019   MCV 88 10/17/2019   PLT 219 10/17/2019   No results found for: IRON, TIBC, FERRITIN  Attestation  Statements:   Reviewed by clinician on day of visit: allergies, medications, problem list, medical history, surgical history, family history, social history, and previous encounter notes.   I, Trixie Dredge, am acting as transcriptionist for Coralie Common, MD.  I have reviewed the above documentation for accuracy and completeness, and I agree with the above. - Jinny Blossom, MD

## 2020-01-15 ENCOUNTER — Encounter (INDEPENDENT_AMBULATORY_CARE_PROVIDER_SITE_OTHER): Payer: Self-pay

## 2020-02-06 ENCOUNTER — Encounter (INDEPENDENT_AMBULATORY_CARE_PROVIDER_SITE_OTHER): Payer: Self-pay | Admitting: Family Medicine

## 2020-02-06 ENCOUNTER — Other Ambulatory Visit: Payer: Self-pay

## 2020-02-06 ENCOUNTER — Ambulatory Visit (INDEPENDENT_AMBULATORY_CARE_PROVIDER_SITE_OTHER): Payer: Commercial Managed Care - PPO | Admitting: Family Medicine

## 2020-02-06 VITALS — BP 116/77 | HR 80 | Temp 98.4°F | Ht 67.0 in | Wt 242.0 lb

## 2020-02-06 DIAGNOSIS — E559 Vitamin D deficiency, unspecified: Secondary | ICD-10-CM

## 2020-02-06 DIAGNOSIS — E1165 Type 2 diabetes mellitus with hyperglycemia: Secondary | ICD-10-CM

## 2020-02-06 DIAGNOSIS — R7401 Elevation of levels of liver transaminase levels: Secondary | ICD-10-CM | POA: Diagnosis not present

## 2020-02-06 DIAGNOSIS — Z9189 Other specified personal risk factors, not elsewhere classified: Secondary | ICD-10-CM

## 2020-02-06 DIAGNOSIS — Z6838 Body mass index (BMI) 38.0-38.9, adult: Secondary | ICD-10-CM

## 2020-02-06 MED ORDER — VITAMIN D (ERGOCALCIFEROL) 1.25 MG (50000 UNIT) PO CAPS: 50000.0000 [IU] | ORAL_CAPSULE | ORAL | 0 refills | Status: DC

## 2020-02-07 NOTE — Progress Notes (Signed)
Chief Complaint:   OBESITY Antonio Hanson is here to discuss his progress with his obesity treatment plan along with follow-up of his obesity related diagnoses. Antonio Hanson is on the Category 4 Plan +200 calories and states he is following his eating plan approximately 75% of the time. Antonio Hanson states he is getting in 10,000 steps per day 5 days per week.  Today's visit was #: 7 Starting weight: 263 lbs Starting date: 10/17/2019 Today's weight: 242 lbs Today's date: 02/06/2020 Total lbs lost to date: 21 lbs Total lbs lost since last in-office visit: 2 lbs  Interim History: Antonio Hanson cooked quite a bit for Thanksgiving for family.  He has been around 75% on plan over the last few weeks.  He says he is still experiencing some guilt and shame with eating off plan.  He is planning to be outside as much as he can.  Subjective:   1. Type 2 diabetes mellitus with hyperglycemia, without long-term current use of insulin ([name]) A1c has improved to 5.9 (previously 6.7).  He is not on medication.  Lab Results  Component Value Date   HGBA1C 5.9 01/08/2020   HGBA1C 6.7 (H) 10/17/2019   Lab Results  Component Value Date   LDLCALC 100 01/08/2020   CREATININE 0.8 01/08/2020   Lab Results  Component Value Date   INSULIN 28.5 (H) 10/17/2019   2. Transaminitis AST and ALT are now within normal limits.  He is not on a statin.  3. Vitamin D deficiency Antonio Hanson's Vitamin D level was 37.3 on 10/17/2019. He is currently taking prescription vitamin D 50,000 IU each week. He denies nausea, vomiting or muscle weakness.  He endorses fatigue.  4. At risk for osteoporosis Antonio Hanson is at higher risk of osteopenia and osteoporosis due to Vitamin D deficiency.   Assessment/Plan:   1. Type 2 diabetes mellitus with hyperglycemia, without long-term current use of insulin ([name]) Continue current meal plan.  No change in treatment.  Close to goal.  2. Transaminitis Repeat labs in February 2022.  3. Vitamin D deficiency Low  Vitamin D level contributes to fatigue and are associated with obesity, breast, and colon cancer. He agrees to continue to take prescription Vitamin D @50 ,000 IU every week and will follow-up for routine testing of Vitamin D, at least 2-3 times per year to avoid over-replacement.  -Refill Vitamin D, Ergocalciferol, (DRISDOL) 1.25 MG (50000 UNIT) CAPS capsule; Take 1 capsule (50,000 Units total) by mouth every 7 (seven) days.  Dispense: 4 capsule; Refill: 0  4. At risk for osteoporosis Antonio Hanson was given approximately 15 minutes of osteoporosis prevention counseling today. Antonio Hanson is at risk for osteopenia and osteoporosis due to his Vitamin D deficiency. He was encouraged to take his Vitamin D and follow his higher calcium diet and increase strengthening exercise to help strengthen his bones and decrease his risk of osteopenia and osteoporosis.  Repetitive spaced learning was employed today to elicit superior memory formation and behavioral change.  5. Class 2 severe obesity with serious comorbidity and body mass index (BMI) of 38.0 to 38.9 in adult, unspecified obesity type ([name])  Antonio Hanson is currently in the action stage of change. As such, his goal is to continue with weight loss efforts. He has agreed to the Category 4 Plan.   Exercise goals: As is.  Behavioral modification strategies: increasing lean protein intake, meal planning and cooking strategies, keeping healthy foods in the home and planning for success.  Antonio Hanson has agreed to follow-up with our clinic in 4  weeks. He was informed of the importance of frequent follow-up visits to maximize his success with intensive lifestyle modifications for his multiple health conditions.   Objective:   Blood pressure 116/77, pulse 80, temperature 98.4 F (36.9 C), temperature source Oral, height 5\' 7"  (1.702 m), weight 242 lb (109.8 kg), SpO2 98 %. Body mass index is 37.9 kg/m.  General: Cooperative, alert, well developed, in no acute distress. HEENT:  Conjunctivae and lids unremarkable. Cardiovascular: Regular rhythm.  Lungs: Normal work of breathing. Neurologic: No focal deficits.   Lab Results  Component Value Date   CREATININE 0.8 01/08/2020   BUN 19 01/08/2020   NA 139 01/08/2020   K 4.5 01/08/2020   CL 103 01/08/2020   CO2 29 (A) 01/08/2020   Lab Results  Component Value Date   ALT 33 01/08/2020   AST 20 01/08/2020   ALKPHOS 105 10/17/2019   BILITOT 0.7 10/17/2019   Lab Results  Component Value Date   HGBA1C 5.9 01/08/2020   HGBA1C 6.7 (H) 10/17/2019   Lab Results  Component Value Date   INSULIN 28.5 (H) 10/17/2019   Lab Results  Component Value Date   TSH 1.730 10/17/2019   Lab Results  Component Value Date   CHOL 167 01/08/2020   HDL 43 01/08/2020   LDLCALC 100 01/08/2020   TRIG 131 01/08/2020   Lab Results  Component Value Date   WBC 9.6 01/08/2020   HGB 17.3 01/08/2020   HCT 52 01/08/2020   MCV 88 10/17/2019   PLT 219 10/17/2019   Lab Results  Component Value Date   FERRITIN 104.3 01/08/2020   Attestation Statements:   Reviewed by clinician on day of visit: allergies, medications, problem list, medical history, surgical history, family history, social history, and previous encounter notes.  I, 13/04/2019, CMA, am acting as transcriptionist for Insurance claims handler, MD.  I have reviewed the above documentation for accuracy and completeness, and I agree with the above. - Reuben Likes, MD

## 2020-03-10 ENCOUNTER — Encounter (INDEPENDENT_AMBULATORY_CARE_PROVIDER_SITE_OTHER): Payer: Self-pay | Admitting: Family Medicine

## 2020-03-10 ENCOUNTER — Ambulatory Visit (INDEPENDENT_AMBULATORY_CARE_PROVIDER_SITE_OTHER): Payer: Commercial Managed Care - PPO | Admitting: Family Medicine

## 2020-03-10 ENCOUNTER — Other Ambulatory Visit: Payer: Self-pay

## 2020-03-10 VITALS — BP 125/78 | HR 84 | Temp 98.2°F | Ht 67.0 in | Wt 245.0 lb

## 2020-03-10 DIAGNOSIS — Z6838 Body mass index (BMI) 38.0-38.9, adult: Secondary | ICD-10-CM

## 2020-03-10 DIAGNOSIS — E559 Vitamin D deficiency, unspecified: Secondary | ICD-10-CM

## 2020-03-10 DIAGNOSIS — Z9189 Other specified personal risk factors, not elsewhere classified: Secondary | ICD-10-CM

## 2020-03-10 DIAGNOSIS — I10 Essential (primary) hypertension: Secondary | ICD-10-CM | POA: Diagnosis not present

## 2020-03-10 MED ORDER — VITAMIN D (ERGOCALCIFEROL) 1.25 MG (50000 UNIT) PO CAPS: 50000.0000 [IU] | ORAL_CAPSULE | ORAL | 0 refills | Status: DC

## 2020-03-11 NOTE — Progress Notes (Signed)
Chief Complaint:   OBESITY Antonio Hanson is here to discuss his progress with his obesity treatment plan along with follow-up of his obesity related diagnoses. [name] is on the Category 4 Plan and states he is following his eating plan approximately 70% of the time. [name] states he is getting in 10,000 steps per day 5 days per week.  Today's visit was #: 8 Starting weight: 263 lbs Starting date: 10/17/2019 Today's weight: 245 lbs Today's date: 03/10/2020 Total lbs lost to date: 18 lbs Total lbs lost since last in-office visit: 0  Interim History: [name] has been busy cooking, eating for Christmas. 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 [name] finds himself wanting to snack at night and is hungry. He is doing beef jerky, [place] bar, peanut butter sandwiches with [employer]life drink and yogurt. He is getting in 10 ounces of meat at night.  Subjective:   1. Vitamin D deficiency Antonio Hanson's Vitamin D level was 37.3 on 10/17/2019. He is currently taking prescription vitamin D 50,000 IU each week. He denies nausea, vomiting or muscle weakness. He endorses fatigue.  2. Essential hypertension Review: taking medications as instructed, no medication side effects noted, no chest pain on exertion, no dyspnea on exertion, no swelling of ankles. Blood pressure is well controlled. No chest pain, chest pressure or headache. He is on Lisinopril.  BP Readings from Last 3 Encounters:  03/10/20 125/78  02/06/20 116/77  01/10/20 115/77   3. At risk for osteoporosis Antonio Hanson is at higher risk of osteopenia and osteoporosis due to Vitamin D deficiency.  Assessment/Plan:   1. Vitamin D deficiency Low Vitamin D level contributes to fatigue and are associated with obesity, breast, and colon cancer. He agrees to continue to take prescription Vitamin D @50 ,000 IU every week and will follow-up for routine testing of Vitamin D, at least 2-3 times per year to avoid over-replacement.  -  Vitamin D, Ergocalciferol, (DRISDOL) 1.25 MG (50000 UNIT) CAPS capsule; Take 1 capsule (50,000 Units total) by mouth every 7 (seven) days.  Dispense: 4 capsule; Refill: 0  2. Essential hypertension Antonio Hanson is working on healthy weight loss and exercise to improve blood pressure control. We will watch for signs of hypotension as he continues his lifestyle modifications. Continue on current medication. No refills needed today.  3. At risk for osteoporosis Antonio Hanson was given approximately 15 minutes of osteoporosis prevention counseling today. Antonio Hanson is at risk for osteopenia and osteoporosis due to his Vitamin D deficiency. He was encouraged to take his Vitamin D and follow his higher calcium diet and increase strengthening exercise to help strengthen his bones and decrease his risk of osteopenia and osteoporosis.  Repetitive spaced learning was employed today to elicit superior memory formation and behavioral change.  4. Class 2 severe obesity with serious comorbidity and body mass index (BMI) of 38.0 to 38.9 in adult, unspecified obesity type ([name])  Antonio Hanson is currently in the action stage of change. As such, his goal is to continue with weight loss efforts. He has agreed to the Category 4 Plan + 100 calories.  Exercise goals: All adults should avoid inactivity. Some physical activity is better than none, and adults who participate in any amount of physical activity gain some health benefits.  Behavioral modification strategies: increasing lean protein intake, meal planning and cooking strategies, keeping healthy foods in the home and planning for success.  [name] has agreed to follow-up with our clinic in 2-3 weeks. He  was informed of the importance of frequent follow-up visits to maximize his success with intensive lifestyle modifications for his multiple health conditions.    Objective:   Blood pressure 125/78, pulse 84, temperature 98.2 F (36.8 C), temperature source Oral, height 5\' 7"  (1.702 m),  weight 245 lb (111.1 kg), SpO2 97 %. Body mass index is 38.37 kg/m.  General: Cooperative, alert, well developed, in no acute distress. HEENT: Conjunctivae and lids unremarkable. Cardiovascular: Regular rhythm.  Lungs: Normal work of breathing. Neurologic: No focal deficits.   Lab Results  Component Value Date   CREATININE 0.8 01/08/2020   BUN 19 01/08/2020   NA 139 01/08/2020   K 4.5 01/08/2020   CL 103 01/08/2020   CO2 29 (A) 01/08/2020   Lab Results  Component Value Date   ALT 33 01/08/2020   AST 20 01/08/2020   ALKPHOS 105 10/17/2019   BILITOT 0.7 10/17/2019   Lab Results  Component Value Date   HGBA1C 5.9 01/08/2020   HGBA1C 6.7 (H) 10/17/2019   Lab Results  Component Value Date   INSULIN 28.5 (H) 10/17/2019   Lab Results  Component Value Date   TSH 1.730 10/17/2019   Lab Results  Component Value Date   CHOL 167 01/08/2020   HDL 43 01/08/2020   LDLCALC 100 01/08/2020   TRIG 131 01/08/2020   Lab Results  Component Value Date   WBC 9.6 01/08/2020   HGB 17.3 01/08/2020   HCT 52 01/08/2020   MCV 88 10/17/2019   PLT 219 10/17/2019   Lab Results  Component Value Date   FERRITIN 104.3 01/08/2020   Attestation Statements:   Reviewed by clinician on day of visit: allergies, medications, problem list, medical history, surgical history, family history, social history, and previous encounter notes.  I, 13/04/2019, am acting as transcriptionist for Delorse Limber, MD.  I have reviewed the above documentation for accuracy and completeness, and I agree with the above. - Reuben Likes, MD

## 2020-03-24 ENCOUNTER — Encounter (INDEPENDENT_AMBULATORY_CARE_PROVIDER_SITE_OTHER): Payer: Self-pay | Admitting: Family Medicine

## 2020-03-24 ENCOUNTER — Other Ambulatory Visit: Payer: Self-pay

## 2020-03-24 ENCOUNTER — Telehealth (INDEPENDENT_AMBULATORY_CARE_PROVIDER_SITE_OTHER): Payer: Self-pay

## 2020-03-24 ENCOUNTER — Telehealth (INDEPENDENT_AMBULATORY_CARE_PROVIDER_SITE_OTHER): Payer: Commercial Managed Care - PPO | Admitting: Family Medicine

## 2020-03-24 DIAGNOSIS — I10 Essential (primary) hypertension: Secondary | ICD-10-CM | POA: Diagnosis not present

## 2020-03-24 DIAGNOSIS — E1165 Type 2 diabetes mellitus with hyperglycemia: Secondary | ICD-10-CM

## 2020-03-24 DIAGNOSIS — Z6838 Body mass index (BMI) 38.0-38.9, adult: Secondary | ICD-10-CM | POA: Diagnosis not present

## 2020-03-24 MED ORDER — RYBELSUS 3 MG PO TABS: 3.0000 mg | ORAL_TABLET | Freq: Every morning | ORAL | 0 refills | Status: DC

## 2020-03-24 NOTE — Telephone Encounter (Signed)
I connected with  Antonio Hanson on 03/24/20 by a video enabled telemedicine application and verified that I am speaking with the correct person using two identifiers.   I discussed the limitations of evaluation and management by telemedicine. The patient expressed understanding and agreed to proceed.

## 2020-03-25 ENCOUNTER — Encounter (INDEPENDENT_AMBULATORY_CARE_PROVIDER_SITE_OTHER): Payer: Self-pay | Admitting: Family Medicine

## 2020-03-26 NOTE — Telephone Encounter (Signed)
Please advise 
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
## 2020-03-27 ENCOUNTER — Encounter (INDEPENDENT_AMBULATORY_CARE_PROVIDER_SITE_OTHER): Payer: Self-pay | Admitting: Family Medicine

## 2020-04-07 ENCOUNTER — Other Ambulatory Visit: Payer: Self-pay

## 2020-04-07 ENCOUNTER — Telehealth (INDEPENDENT_AMBULATORY_CARE_PROVIDER_SITE_OTHER): Payer: Self-pay

## 2020-04-07 ENCOUNTER — Ambulatory Visit (INDEPENDENT_AMBULATORY_CARE_PROVIDER_SITE_OTHER): Payer: Commercial Managed Care - PPO | Admitting: Family Medicine

## 2020-04-07 ENCOUNTER — Encounter (INDEPENDENT_AMBULATORY_CARE_PROVIDER_SITE_OTHER): Payer: Self-pay | Admitting: Family Medicine

## 2020-04-07 VITALS — BP 122/79 | HR 87 | Temp 98.1°F | Ht 67.0 in | Wt 242.0 lb

## 2020-04-07 DIAGNOSIS — E559 Vitamin D deficiency, unspecified: Secondary | ICD-10-CM

## 2020-04-07 DIAGNOSIS — Z9189 Other specified personal risk factors, not elsewhere classified: Secondary | ICD-10-CM

## 2020-04-07 DIAGNOSIS — Z6838 Body mass index (BMI) 38.0-38.9, adult: Secondary | ICD-10-CM | POA: Diagnosis not present

## 2020-04-07 DIAGNOSIS — E1165 Type 2 diabetes mellitus with hyperglycemia: Secondary | ICD-10-CM

## 2020-04-07 MED ORDER — OZEMPIC (0.25 OR 0.5 MG/DOSE) 2 MG/1.5ML ~~LOC~~ SOPN: 0.2500 mg | PEN_INJECTOR | SUBCUTANEOUS | 0 refills | Status: DC

## 2020-04-07 MED ORDER — VITAMIN D (ERGOCALCIFEROL) 1.25 MG (50000 UNIT) PO CAPS: 50000.0000 [IU] | ORAL_CAPSULE | ORAL | 0 refills | Status: DC

## 2020-04-07 NOTE — Telephone Encounter (Signed)
Attempted to initiate PA for Ozempic and received notification that a PA is not required for this medication.   Key: [id number]  Ozempic (0.25 or 0.5 MG/DOSE) 2MG /1.5ML pen-injectors   Form: Rx [employer]) [employer] Prior Authorization Form 2017 Determination: Message from Plan OZEMPIC 0.25 OR .5 PEN INJCTR is covered for this Member without Prior Authorization.
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
## 2020-05-08 ENCOUNTER — Ambulatory Visit (INDEPENDENT_AMBULATORY_CARE_PROVIDER_SITE_OTHER): Payer: Commercial Managed Care - PPO | Admitting: Family Medicine

## 2020-05-08 ENCOUNTER — Other Ambulatory Visit: Payer: Self-pay

## 2020-05-08 ENCOUNTER — Encounter (INDEPENDENT_AMBULATORY_CARE_PROVIDER_SITE_OTHER): Payer: Self-pay | Admitting: Family Medicine

## 2020-05-08 VITALS — BP 124/78 | HR 91 | Temp 98.3°F | Ht 67.0 in | Wt 242.0 lb

## 2020-05-08 DIAGNOSIS — E559 Vitamin D deficiency, unspecified: Secondary | ICD-10-CM | POA: Diagnosis not present

## 2020-05-08 DIAGNOSIS — Z9189 Other specified personal risk factors, not elsewhere classified: Secondary | ICD-10-CM

## 2020-05-08 DIAGNOSIS — E1165 Type 2 diabetes mellitus with hyperglycemia: Secondary | ICD-10-CM

## 2020-05-08 DIAGNOSIS — Z6837 Body mass index (BMI) 37.0-37.9, adult: Secondary | ICD-10-CM

## 2020-05-08 MED ORDER — TRULICITY 0.75 MG/0.5ML ~~LOC~~ SOAJ
0.7500 mg | SUBCUTANEOUS | 0 refills | Status: DC
Start: 2020-05-08 — End: 2022-04-30

## 2020-05-11 ENCOUNTER — Other Ambulatory Visit (INDEPENDENT_AMBULATORY_CARE_PROVIDER_SITE_OTHER): Payer: Self-pay | Admitting: Family Medicine

## 2020-05-11 DIAGNOSIS — E559 Vitamin D deficiency, unspecified: Secondary | ICD-10-CM

## 2020-05-12 ENCOUNTER — Encounter (INDEPENDENT_AMBULATORY_CARE_PROVIDER_SITE_OTHER): Payer: Self-pay

## 2020-05-12 NOTE — Progress Notes (Signed)
Chief Complaint:   OBESITY Antonio Hanson is here to discuss his progress with his obesity treatment plan along with follow-up of his obesity related diagnoses. Antonio Hanson is on the Category 4 Plan and states he is following his eating plan approximately 75% of the time. Antonio Hanson states he is doing 18,000 steps 3 times per week.  Today's visit was #: 11 Starting weight: 263 lbs Starting date: 10/17/2019 Today's weight: 242 lbs Today's date: 05/08/2020 Total lbs lost to date: 21 lbs Total lbs lost since last in-office visit: 0  Interim History: Breakfast and lunch are on meal plan completely. Dinner is often a struggle for food that's off plan. Occasionally doing rice, potato or fried meat. Protein wise, at dinner, pt isn't always weighing protein. Pt also feeling occasionally frustrated/guilty when he indulges at dinner.  Subjective:   1. Type 2 diabetes mellitus with hyperglycemia, without long-term current use of insulin ([name]) Antonio Hanson hasn't started GLP-1 yet. Rybelsus is not covered and Ozempic is > $900.   2. Vitamin D deficiency Pt denies nausea, vomiting, and muscle weakness but notes fatigue. Pt is on prescription Vit D.  3. At risk for side effect of medication Antonio Hanson is at risk for side effect of medication due to hoping to start GLP-1.  Assessment/Plan:   1. Type 2 diabetes mellitus with hyperglycemia, without long-term current use of insulin ([name]) Good blood sugar control is important to decrease the likelihood of diabetic complications such as nephropathy, neuropathy, limb loss, blindness, coronary artery disease, and death. Intensive lifestyle modification including diet, exercise and weight loss are the first line of treatment for diabetes. Stop Ozempic; Start Trulicity 0.75 mg, as per below.  - Dulaglutide (TRULICITY) 0.75 MG/0.5ML SOPN; Inject 0.75 mg into the skin once a week.  Dispense: 2 mL; Refill: 0  2. Vitamin D deficiency Low Vitamin D level contributes to fatigue and are  associated with obesity, breast, and colon cancer. He agrees to continue to take prescription Vitamin D @50 ,000 IU every week and will follow-up for routine testing of Vitamin D, at least 2-3 times per year to avoid over-replacement.  3. At risk for side effect of medication Antonio Hanson was given approximately 15 minutes of drug side effect counseling today.  We discussed side effect possibility and risk versus benefits. Antonio Hanson agreed to the medication and will contact this office if these side effects are intolerable.  Repetitive spaced learning was employed today to elicit superior memory formation and behavioral change.  4. Class 2 severe obesity with serious comorbidity and body mass index (BMI) of 37.0 to 37.9 in adult, unspecified obesity type ([name]) Antonio Hanson is currently in the action stage of change. As such, his goal is to continue with weight loss efforts. He has agreed to the Category 4 Plan.   Exercise goals: All adults should avoid inactivity. Some physical activity is better than none, and adults who participate in any amount of physical activity gain some health benefits.  Behavioral modification strategies: increasing lean protein intake, meal planning and cooking strategies, keeping healthy foods in the home and planning for success.  Antonio Hanson has agreed to follow-up with our clinic in 4 weeks. He was informed of the importance of frequent follow-up visits to maximize his success with intensive lifestyle modifications for his multiple health conditions.   Objective:   Blood pressure 124/78, pulse 91, temperature 98.3 F (36.8 C), temperature source Oral, height 5\' 7"  (1.702 m), weight 242 lb (109.8 kg), SpO2 98 %. Body mass index is  37.9 kg/m.  General: Cooperative, alert, well developed, in no acute distress. HEENT: Conjunctivae and lids unremarkable. Cardiovascular: Regular rhythm.  Lungs: Normal work of breathing. Neurologic: No focal deficits.   Lab Results  Component Value Date    CREATININE 0.8 01/08/2020   BUN 19 01/08/2020   NA 139 01/08/2020   K 4.5 01/08/2020   CL 103 01/08/2020   CO2 29 (A) 01/08/2020   Lab Results  Component Value Date   ALT 33 01/08/2020   AST 20 01/08/2020   ALKPHOS 105 10/17/2019   BILITOT 0.7 10/17/2019   Lab Results  Component Value Date   HGBA1C 5.9 01/08/2020   HGBA1C 6.7 (H) 10/17/2019   Lab Results  Component Value Date   INSULIN 28.5 (H) 10/17/2019   Lab Results  Component Value Date   TSH 1.730 10/17/2019   Lab Results  Component Value Date   CHOL 167 01/08/2020   HDL 43 01/08/2020   LDLCALC 100 01/08/2020   TRIG 131 01/08/2020   Lab Results  Component Value Date   WBC 9.6 01/08/2020   HGB 17.3 01/08/2020   HCT 52 01/08/2020   MCV 88 10/17/2019   PLT 219 10/17/2019   Lab Results  Component Value Date   FERRITIN 104.3 01/08/2020    Attestation Statements:   Reviewed by clinician on day of visit: allergies, medications, problem list, medical history, surgical history, family history, social history, and previous encounter notes.  This is the patient's first visit at [facility]. The patient's NEW PATIENT PACKET was reviewed at length. Included in the packet: current and past health history, medications, allergies, ROS, gynecologic history (women only), surgical history, family history, social history, weight history, weight loss surgery history (for those that have had weight loss surgery), nutritional evaluation, mood and food questionnaire, PHQ9, Epworth questionnaire, sleep habits questionnaire, patient life and health improvement goals questionnaire. These will all be scanned into the patient's chart under media.   During the visit, I independently reviewed the patient's EKG, bioimpedance scale results, and indirect calorimeter results. I used this information to tailor a meal plan for the patient that will help him to lose weight and will improve his obesity-related conditions going  forward. I performed a medically necessary appropriate examination and/or evaluation. I discussed the assessment and treatment plan with the patient. The patient was provided an opportunity to ask questions and all were answered. The patient agreed with the plan and demonstrated an understanding of the instructions. Labs were ordered at this visit and will be reviewed at the next visit unless more critical results need to be addressed immediately. Clinical information was updated and documented in the EMR.   Time spent on visit including pre-visit chart review and post-visit care was 45 minutes.   A separate 15 minutes was spent on risk counseling (see above).    Edmund Hilda, am acting as transcriptionist for Reuben Likes, MD.   I have reviewed the above documentation for accuracy and completeness, and I agree with the above. - Katherina Mires, MD

## 2020-05-20 ENCOUNTER — Encounter (INDEPENDENT_AMBULATORY_CARE_PROVIDER_SITE_OTHER): Payer: Self-pay | Admitting: Family Medicine

## 2020-05-20 NOTE — Telephone Encounter (Signed)
Please advise 

## 2020-05-22 NOTE — Telephone Encounter (Signed)
Please advise 

## 2020-05-22 NOTE — Telephone Encounter (Signed)
FYI

## 2020-06-12 ENCOUNTER — Ambulatory Visit (INDEPENDENT_AMBULATORY_CARE_PROVIDER_SITE_OTHER): Payer: Commercial Managed Care - PPO | Admitting: Family Medicine

## 2021-02-10 ENCOUNTER — Telehealth: Payer: Self-pay | Admitting: *Deleted

## 2021-02-11 ENCOUNTER — Other Ambulatory Visit: Payer: Self-pay | Admitting: Nurse Practitioner

## 2021-02-11 DIAGNOSIS — R7989 Other specified abnormal findings of blood chemistry: Secondary | ICD-10-CM

## 2021-02-23 ENCOUNTER — Other Ambulatory Visit: Payer: Self-pay

## 2021-02-23 ENCOUNTER — Inpatient Hospital Stay: Payer: 59 | Attending: Nurse Practitioner

## 2021-02-23 DIAGNOSIS — R7989 Other specified abnormal findings of blood chemistry: Secondary | ICD-10-CM

## 2021-02-23 LAB — FERRITIN: Ferritin: 257 ng/mL (ref 24–336)

## 2021-02-24 NOTE — Progress Notes (Signed)
New Hematology/Oncology Consult   Requesting MD:  Dr. Libby Maw  [phone]      Reason for Consult: Hemochromatosis  HPI: Antonio Hanson is a 55 year old man referred for evaluation of hemochromatosis.  Labs from 05/20/2012 showed 2 copies of the H63D mutation.  He has been followed by Dr. Marisue Humble with routine ferritin levels.  Most recent ferritin  from 02/06/2021 returned elevated at 386 (23.9-336.2); additional results from 02/09/2021 TIBC 321 (250-450), Iron 107 (50-212), iron saturation 33% (20-55), Transferrin 229 (203-362), albumin normal at 4.6, total bilirubin normal at 0.6, alk phos normal at 92, AST elevated at 51 (0-39), ALT elevated at 94 (0-52).   Prior ferritin results: 01/08/2020 104, 06/29/2019 278, 12/24/2018 182.  We checked a ferritin here on 02/23/2021-257 (24-336).    Antonio Hanson reports liver enzymes have been elevated on multiple occasions.  He had an abdominal ultrasound 06/13/2015 to evaluate elevated liver enzymes-diffuse increase in liver echogenicity finding most likely due to hepatic steatosis.  No focal liver lesions identified.   Past Medical History:  Diagnosis Date   Back pain    Elevated LFTs    Fatty liver    GERD (gastroesophageal reflux disease)    H/O multiple allergies    Heartburn    Hemochromatosis    HTN (hypertension)    white coat component   Joint pain    Migraines    OSA on CPAP    severe OSA with AHI 92/hr by sleep study 2014 on CPAP   Prediabetes    Sleep apnea      Past Surgical History:  Procedure Laterality Date   none       Current Outpatient Medications:    acetaminophen (TYLENOL) 500 MG tablet, Take 500 mg by mouth every 6 (six) hours as needed., Disp: , Rfl:    lisinopril (PRINIVIL,ZESTRIL) 20 MG tablet, Take 20 mg by mouth daily., Disp: , Rfl:    loratadine (CLARITIN) 10 MG tablet, Take 10 mg by mouth daily., Disp: , Rfl:    metoCLOPramide (REGLAN) 10 MG tablet, Take 10 mg by mouth every 6 (six) hours as needed.  , Disp: , Rfl:    omeprazole (PRILOSEC) 40 MG capsule, Take 40 mg by mouth daily., Disp: , Rfl:    ondansetron (ZOFRAN) 4 MG tablet, Take 4 mg by mouth every 8 (eight) hours as needed for nausea or vomiting., Disp: , Rfl:    diclofenac (VOLTAREN) 75 MG EC tablet, Take 75 mg by mouth 2 (two) times daily. (Patient not taking: Reported on 02/25/2021), Disp: , Rfl:    Dulaglutide (TRULICITY) 3.79 KW/4.0XB SOPN, Inject 0.75 mg into the skin once a week. (Patient not taking: Reported on 02/25/2021), Disp: 2 mL, Rfl: 0   rosuvastatin (CRESTOR) 10 MG tablet, Take 10 mg by mouth every 3 (three) days., Disp: , Rfl:    Vitamin D, Ergocalciferol, (DRISDOL) 1.25 MG (50000 UNIT) CAPS capsule, TAKE 1 CAPSULE (50,000 UNITS TOTAL) BY MOUTH EVERY 7 (SEVEN) DAYS (Patient not taking: Reported on 02/25/2021), Disp: 4 capsule, Rfl: 0:   No Known Allergies:  FH: Mother deceased age 42 with non-Hodgkin's lymphoma; father living, hypertension, obesity.  He is not aware of anyone with hemochromatosis.  He has a son who he reports is seeing a hematologist next week for "hemochromatosis".    SOCIAL HISTORY: He lives in [place].  He is married.  He has 2 children.  He quit smoking about 25 years ago.  He discontinued alcohol intake 2 to 3 weeks  ago.  Prior to quitting 3 weeks ago he reports 3 gin drinks a night and a 12 pack of beer on the weekends for about 10 years.  He reports drinking socially greater than 10 years ago.  Review of Systems:  Physical Exam:  Blood pressure (!) 144/93, pulse 93, temperature 97.8 F (36.6 C), temperature source Oral, resp. rate 18, height '5\' 7"'  (1.702 m), weight 259 lb 3.2 oz (117.6 kg), SpO2 100 %.  HEENT: No thrush or ulcers.  Sclera anicteric. Lungs: Lungs clear bilaterally. Cardiac: Regular rate and rhythm. Abdomen: Abdomen is protuberant.  No hepatosplenomegaly.  Prominent vein pattern over the abdominal wall. Vascular: No leg edema.   Lymph nodes: No palpable cervical,  supraclavicular or axillary lymph nodes. Neurologic: Alert and oriented. Skin: Spider telangiectasias lower neck/upper chest.  Brown discoloration at the lower legs.   RADIOLOGY:  No results found.  Assessment and Plan:   Homozygote H63D gene mutation Hypertension Significant alcohol use Elevated liver enzymes Probable hepatic steatosis on abdominal ultrasound 06/13/2015  Antonio Hanson is a homozygote for the H63D gene mutation.  It is unlikely he will develop iron overload.  The ferritin has been stable, mainly in the normal range, over several years and other iron studies are normal.  We discussed a liver biopsy or liver MRI to evaluate for iron deposition.  He would like to hold on this at present.  We expressed our concern that the elevated liver enzymes may indicate liver disease in the setting of significant alcohol use.  We made a referral to Dr. Michail Sermon for further evaluation.  We recommend that he abstain from alcohol.  We did not schedule follow-up in our office but are available to see him in the future if there is a progressive rise in the ferritin or there are other concerns for iron overload.  Patient seen with Dr. Benay Spice.   Ned Card, NP 02/25/2021, 12:59 PM   This was a shared visit with Ned Card.  Antonio Hanson was interviewed and examined.  Prefer to consider a diagnosis of hemochromatosis.  He is homozygous for the H63D mutation.  We explained patients with this genotype rarely develop clinical hemochromatosis.  He does not appear to have hemochromatosis.  The ferritin level has not changed significantly over many years.  The ferritin was normal on 02/23/2021.  The serum iron has been normal.  He does not wish to undergo a liver MRI to evaluate for iron overload.  I suspect the mild elevation of the ferritin level in his case is related to alcohol induced hepatitis and steatohepatitis.  He may have cirrhosis.  There are physical exam findings suggestive of  cirrhosis.  We encouraged him to discontinue alcohol use.  We will refer him to Dr. Michail Sermon to get his opinion regarding the elevated liver enzymes and consider a diagnosis of cirrhosis.  He will continue follow-up with Dr.Ehinger and Dr. Michail Sermon.  We are available to see him in the future as needed.  I was present for greater than 50% of today's visit.  I performed medical decision making.  Julieanne Manson, MD

## 2021-02-25 ENCOUNTER — Encounter: Payer: Self-pay | Admitting: Nurse Practitioner

## 2021-02-25 ENCOUNTER — Other Ambulatory Visit: Payer: Self-pay

## 2021-02-25 ENCOUNTER — Inpatient Hospital Stay (HOSPITAL_BASED_OUTPATIENT_CLINIC_OR_DEPARTMENT_OTHER): Payer: 59 | Admitting: Nurse Practitioner

## 2021-02-25 VITALS — BP 144/93 | HR 93 | Temp 97.8°F | Resp 18 | Ht 67.0 in | Wt 259.2 lb

## 2021-02-25 DIAGNOSIS — R748 Abnormal levels of other serum enzymes: Secondary | ICD-10-CM

## 2021-02-25 DIAGNOSIS — F1020 Alcohol dependence, uncomplicated: Secondary | ICD-10-CM

## 2021-02-25 DIAGNOSIS — I10 Essential (primary) hypertension: Secondary | ICD-10-CM | POA: Diagnosis not present

## 2021-02-25 DIAGNOSIS — R7401 Elevation of levels of liver transaminase levels: Secondary | ICD-10-CM | POA: Diagnosis not present

## 2021-02-27 ENCOUNTER — Encounter: Payer: Self-pay | Admitting: *Deleted

## 2021-02-27 NOTE — Progress Notes (Signed)
Referral order, demographics and chart information emailed to [email or web address] after called to confirm. Have attempted to fax referral X [phone] without success.

## 2021-09-03 LAB — HM DIABETES EYE EXAM

## 2021-10-14 ENCOUNTER — Encounter (INDEPENDENT_AMBULATORY_CARE_PROVIDER_SITE_OTHER): Payer: Self-pay

## 2022-03-24 LAB — LAB REPORT - SCANNED
Hemoglobin-A1c: 7.2
eGFR: 107

## 2022-04-27 ENCOUNTER — Ambulatory Visit: Payer: 59 | Admitting: Family Medicine

## 2022-04-30 ENCOUNTER — Encounter: Payer: Self-pay | Admitting: Family Medicine

## 2022-04-30 ENCOUNTER — Ambulatory Visit (INDEPENDENT_AMBULATORY_CARE_PROVIDER_SITE_OTHER): Payer: 59 | Admitting: Family Medicine

## 2022-04-30 VITALS — BP 120/86 | HR 86 | Temp 97.7°F | Ht 67.0 in | Wt 258.0 lb

## 2022-04-30 DIAGNOSIS — E1165 Type 2 diabetes mellitus with hyperglycemia: Secondary | ICD-10-CM | POA: Diagnosis not present

## 2022-04-30 DIAGNOSIS — I10 Essential (primary) hypertension: Secondary | ICD-10-CM

## 2022-04-30 DIAGNOSIS — Z6841 Body Mass Index (BMI) 40.0 and over, adult: Secondary | ICD-10-CM

## 2022-04-30 DIAGNOSIS — Z7689 Persons encountering health services in other specified circumstances: Secondary | ICD-10-CM | POA: Insufficient documentation

## 2022-04-30 MED ORDER — MOUNJARO 2.5 MG/0.5ML ~~LOC~~ SOAJ: 5.0000 mg | SUBCUTANEOUS | 1 refills | Status: DC

## 2022-04-30 NOTE — Patient Instructions (Signed)
It was great to meet you today and I'm excited to have you join the [facility] practice. I hope you had a positive experience today! If you feel so inclined, please feel free to recommend our practice to friends and family. Mila Merry, FNP-C

## 2022-04-30 NOTE — Assessment & Plan Note (Addendum)
HM items reviewed and UTD. Has requested medical records for review.

## 2022-04-30 NOTE — Assessment & Plan Note (Signed)
Well controlled. He checks daily at home and readings are <140/90. Continue Lisinopril '20mg'$  daily. Denies chest pain, palpitations, shortness of breath, headaches, vision changes, lightheadedness/dizziness and will seek medical care if symptoms occur.

## 2022-04-30 NOTE — Assessment & Plan Note (Signed)
Last A1c 7.2%. Started on mounjaro 2.'5mg'$  weekly 1/29 with plans to increase to '5mg'$  weekly after 4 weeks and stop Jardiance. Will continue with this plan and recheck A1c in 2 months.

## 2022-04-30 NOTE — Progress Notes (Signed)
New Patient Office Visit  Subjective    Patient ID: Antonio Hanson, male    DOB: [birth year]  Age: 57 y.o. MRN: [id number]  CC:  Chief Complaint  Patient presents with   Establish Care    HPI Antonio Hanson presents to establish care. Oriented to practice routines and expectations. Seen twice annually at [facility], has hemachromatosis, DM2 (last A1c 7.2%, started Mounjaro 2.5 1/29, tolerating well, Jardiance '10mg'$  daily, plan with PCP was to increase Mounjaro to '5mg'$  weekly and DC Jardiance),  HTN well controlled <140/90 on Lisinopril '20mg'$ , HLD, obesity. No concerns today.  Physical done 04/04/22 Vaccines UTD PSA done Colon CA: colonoscopy 2-3y ago normal, q5y due to polyps Tobacco denies BMI 40.60 Diabetic kidney, eye, foot UTD Dentist twice annually  Per PCP note 02/10/2022: Hemochromatosis 05/2012 (H63D and H63D mutations) low risk of symptoms, annual CBC, iron ferritin  Outpatient Encounter Medications as of 04/30/2022  Medication Sig   acetaminophen (TYLENOL) 500 MG tablet Take 500 mg by mouth every 6 (six) hours as needed.   fexofenadine (ALLEGRA ODT) 30 MG disintegrating tablet Take 30 mg by mouth daily.   lisinopril (PRINIVIL,ZESTRIL) 20 MG tablet Take 20 mg by mouth daily.   metoCLOPramide (REGLAN) 10 MG tablet Take 10 mg by mouth every 6 (six) hours as needed.    omeprazole (PRILOSEC) 40 MG capsule Take 40 mg by mouth daily.   ondansetron (ZOFRAN) 4 MG tablet Take 4 mg by mouth every 8 (eight) hours as needed for nausea or vomiting.   rosuvastatin (CRESTOR) 10 MG tablet Take 10 mg by mouth every 3 (three) days.   Vitamin D, Ergocalciferol, (DRISDOL) 1.25 MG (50000 UNIT) CAPS capsule TAKE 1 CAPSULE (50,000 UNITS TOTAL) BY MOUTH EVERY 7 (SEVEN) DAYS   [DISCONTINUED] empagliflozin (JARDIANCE) 10 MG TABS tablet Take by mouth daily.   [DISCONTINUED] tirzepatide [facility]) 2.5 MG/0.5ML Pen Inject 2.5 mg into the skin once a week.   [START ON 05/06/2022] tirzepatide (MOUNJARO) 2.5  MG/0.5ML Pen Inject 5 mg into the skin once a week.   [DISCONTINUED] diclofenac (VOLTAREN) 75 MG EC tablet Take 75 mg by mouth 2 (two) times daily. (Patient not taking: Reported on 02/25/2021)   [DISCONTINUED] Dulaglutide (TRULICITY) [id number] [id number] SOPN Inject 0.75 mg into the skin once a week. (Patient not taking: Reported on 02/25/2021)   [DISCONTINUED] loratadine (CLARITIN) 10 MG tablet Take 10 mg by mouth daily. (Patient not taking: Reported on 04/30/2022)   No facility-administered encounter medications on file as of 04/30/2022.    Past Medical History:  Diagnosis Date   Allergy    Seasonal but take Allegra daily   Back pain    Elevated LFTs    Fatty liver    GERD (gastroesophageal reflux disease)    H/O multiple allergies    Heartburn    Hemochromatosis    HTN (hypertension)    white coat component   Joint pain    Migraines    OSA on CPAP    severe OSA with AHI 92/hr by sleep study 2014 on CPAP   Prediabetes    Sleep apnea     Past Surgical History:  Procedure Laterality Date   none      Family History  Problem Relation Age of Onset   Lymphoma Mother    Cancer Mother    Early death Mother    Hypertension Father    Diabetes Father    Obesity Father     Social History   Socioeconomic History  Marital status: Married    Spouse name: Not on file   Number of children: Not on file   Years of education: Not on file   Highest education level: Not on file  Occupational History   Occupation: semi-Retired  Tobacco Use   Smoking status: Former    Packs/day: 1.00    Years: 10.00    Total pack years: 10.00    Types: Cigarettes    Quit date: 03/08/1993    Years since quitting: 29.1   Smokeless tobacco: Never  Substance and Sexual Activity   Alcohol use: Yes    Alcohol/week: 12.0 standard drinks of alcohol    Types: 12 Shots of liquor per week   Drug use: Never   Sexual activity: Yes    Birth control/protection: None  Other Topics Concern   Not on file   Social History Narrative   Not on file   Social Determinants of Health   Financial Resource Strain: Not on file  Food Insecurity: Not on file  Transportation Needs: Not on file  Physical Activity: Not on file  Stress: Not on file  Social Connections: Not on file  Intimate Partner Violence: Not on file    Review of Systems  All other systems reviewed and are negative.       Objective    BP 120/86   Pulse 86   Temp 97.7 F (36.5 C) (Oral)   Ht '5\' 7"'$  (1.702 m)   Wt 258 lb (117 kg)   SpO2 97%   BMI 40.41 kg/m   Physical Exam Vitals and nursing note reviewed.  Constitutional:      Appearance: Normal appearance. He is normal weight.  HENT:     Head: Normocephalic and atraumatic.  Cardiovascular:     Rate and Rhythm: Normal rate and regular rhythm.     Pulses: Normal pulses.     Heart sounds: Normal heart sounds.  Skin:    General: Skin is warm and dry.     Capillary Refill: Capillary refill takes less than 2 seconds.  Neurological:     General: No focal deficit present.     Mental Status: He is alert and oriented to person, place, and time. Mental status is at baseline.  Psychiatric:        Mood and Affect: Mood normal.        Behavior: Behavior normal.        Thought Content: Thought content normal.        Judgment: Judgment normal.         Assessment & Plan:   Problem List Items Addressed This Visit       Cardiovascular and Mediastinum   Benign essential HTN    Well controlled. He checks daily at home and readings are <140/90. Continue Lisinopril '20mg'$  daily. Denies chest pain, palpitations, shortness of breath, headaches, vision changes, lightheadedness/dizziness and will seek medical care if symptoms occur.        Endocrine   Type 2 diabetes mellitus with hyperglycemia, without long-term current use of insulin ([name]) - Primary    Last A1c 7.2%. Started on mounjaro 2.'5mg'$  weekly 1/29 with plans to increase to '5mg'$  weekly after 4 weeks and stop  Jardiance. Will continue with this plan and recheck A1c in 2 months.       Relevant Medications   tirzepatide [facility]) 2.5 MG/0.5ML Pen (Start on 05/06/2022)     Other   Class 3 severe obesity with serious comorbidity and body mass index (BMI) of  40.0 to 44.9 in adult [facility])    Exercises daily, reports 8lb weight loss, encouraged heart healthy diet, recently started on Mounjaro and tolerating well, will increase to '5mg'$  weekly next week.      Relevant Medications   tirzepatide [facility]) 2.5 MG/0.5ML Pen (Start on 05/06/2022)   Encounter to establish care with new doctor    HM items reviewed and UTD. Has requested medical records for review.       Return in about 5 months (around 09/28/2022) for 5 month labs (A1c, CMP), annual physical after 04/04/2022.   Rubie Maid, FNP

## 2022-04-30 NOTE — Assessment & Plan Note (Signed)
Exercises daily, reports 8lb weight loss, encouraged heart healthy diet, recently started on Mounjaro and tolerating well, will increase to '5mg'$  weekly next week.

## 2022-05-03 ENCOUNTER — Other Ambulatory Visit: Payer: Self-pay | Admitting: Family Medicine

## 2022-05-04 ENCOUNTER — Other Ambulatory Visit: Payer: Self-pay

## 2022-05-04 ENCOUNTER — Other Ambulatory Visit: Payer: Self-pay | Admitting: Family Medicine

## 2022-05-04 ENCOUNTER — Encounter: Payer: Self-pay | Admitting: Family Medicine

## 2022-05-04 DIAGNOSIS — Z86010 Personal history of colonic polyps: Secondary | ICD-10-CM | POA: Insufficient documentation

## 2022-05-04 DIAGNOSIS — K760 Fatty (change of) liver, not elsewhere classified: Secondary | ICD-10-CM | POA: Insufficient documentation

## 2022-05-04 DIAGNOSIS — G4733 Obstructive sleep apnea (adult) (pediatric): Secondary | ICD-10-CM | POA: Insufficient documentation

## 2022-05-04 DIAGNOSIS — J309 Allergic rhinitis, unspecified: Secondary | ICD-10-CM | POA: Insufficient documentation

## 2022-05-04 DIAGNOSIS — I10 Essential (primary) hypertension: Secondary | ICD-10-CM | POA: Insufficient documentation

## 2022-05-04 DIAGNOSIS — K219 Gastro-esophageal reflux disease without esophagitis: Secondary | ICD-10-CM | POA: Insufficient documentation

## 2022-05-04 DIAGNOSIS — G43909 Migraine, unspecified, not intractable, without status migrainosus: Secondary | ICD-10-CM | POA: Insufficient documentation

## 2022-05-04 DIAGNOSIS — K119 Disease of salivary gland, unspecified: Secondary | ICD-10-CM | POA: Insufficient documentation

## 2022-05-04 DIAGNOSIS — E1169 Type 2 diabetes mellitus with other specified complication: Secondary | ICD-10-CM | POA: Insufficient documentation

## 2022-05-04 MED ORDER — MOUNJARO 2.5 MG/0.5ML ~~LOC~~ SOAJ: 5.0000 mg | SUBCUTANEOUS | 1 refills | Status: DC

## 2022-05-04 MED ORDER — TIRZEPATIDE 5 MG/0.5ML ~~LOC~~ SOAJ: 5.0000 mg | SUBCUTANEOUS | 1 refills | Status: DC

## 2022-05-17 ENCOUNTER — Encounter: Payer: Self-pay | Admitting: Family Medicine

## 2022-05-20 ENCOUNTER — Other Ambulatory Visit: Payer: Self-pay | Admitting: Family Medicine

## 2022-05-20 MED ORDER — TIRZEPATIDE 7.5 MG/0.5ML ~~LOC~~ SOAJ: 7.5000 mg | SUBCUTANEOUS | 0 refills | Status: DC

## 2022-05-21 ENCOUNTER — Other Ambulatory Visit: Payer: Self-pay | Admitting: Family Medicine

## 2022-05-24 ENCOUNTER — Other Ambulatory Visit: Payer: Self-pay | Admitting: Family Medicine

## 2022-05-25 ENCOUNTER — Other Ambulatory Visit: Payer: Self-pay | Admitting: Family Medicine

## 2022-06-08 ENCOUNTER — Other Ambulatory Visit: Payer: Self-pay | Admitting: Family Medicine

## 2022-06-08 ENCOUNTER — Other Ambulatory Visit (HOSPITAL_COMMUNITY): Payer: Self-pay

## 2022-06-08 MED ORDER — ROSUVASTATIN CALCIUM 10 MG PO TABS
10.0000 mg | ORAL_TABLET | ORAL | 3 refills | Status: DC
  Filled 2022-06-08: qty 10, 30d supply, fill #0

## 2022-06-15 ENCOUNTER — Encounter: Payer: Self-pay | Admitting: Family Medicine

## 2022-06-15 ENCOUNTER — Other Ambulatory Visit: Payer: Self-pay

## 2022-06-15 DIAGNOSIS — K760 Fatty (change of) liver, not elsewhere classified: Secondary | ICD-10-CM

## 2022-06-15 DIAGNOSIS — E1165 Type 2 diabetes mellitus with hyperglycemia: Secondary | ICD-10-CM

## 2022-06-15 DIAGNOSIS — E66813 Obesity, class 3: Secondary | ICD-10-CM

## 2022-06-15 MED ORDER — ROSUVASTATIN CALCIUM 10 MG PO TABS: 10.0000 mg | ORAL_TABLET | ORAL | 3 refills | Status: DC

## 2022-06-21 ENCOUNTER — Encounter: Payer: Self-pay | Admitting: Family Medicine

## 2022-06-21 ENCOUNTER — Other Ambulatory Visit: Payer: Self-pay | Admitting: Family Medicine

## 2022-06-21 ENCOUNTER — Other Ambulatory Visit (HOSPITAL_COMMUNITY): Payer: Self-pay

## 2022-06-21 MED ORDER — MOUNJARO 7.5 MG/0.5ML ~~LOC~~ SOAJ
7.5000 mg | SUBCUTANEOUS | 0 refills | Status: DC
  Filled 2022-06-21: qty 6, fill #0
  Filled 2022-06-21: qty 2, 28d supply, fill #0

## 2022-06-27 ENCOUNTER — Encounter: Payer: Self-pay | Admitting: Family Medicine

## 2022-07-02 ENCOUNTER — Ambulatory Visit
Admission: EM | Admit: 2022-07-02 | Discharge: 2022-07-02 | Disposition: A | Discharge status: Home / Self Care | Payer: No Typology Code available for payment source

## 2022-07-02 DIAGNOSIS — T50905A Adverse effect of unspecified drugs, medicaments and biological substances, initial encounter: Secondary | ICD-10-CM

## 2022-07-02 MED ORDER — TRIAMCINOLONE ACETONIDE 0.1 % EX CREA: 1.0000 | TOPICAL_CREAM | Freq: Two times a day (BID) | CUTANEOUS | 0 refills | Status: DC

## 2022-07-02 NOTE — ED Provider Notes (Signed)
[facility]    CSN: [id number] Arrival date & time: 07/02/22  1546      History   Chief Complaint No chief complaint on file.   HPI Antonio Hanson is a 57 y.o. male.   The history is provided by the patient.   The patient presents with a localized reaction to the right upper extremity after he administered Mounjaro 7.5mg .  He states that he has not had any problems with lower dosings of the Mounjaro in the past.  Patient states he administer the medication 2 days ago, but noticed redness, tenderness, and warmth to the back of the left arm 1 day ago.  He denies fever, chills, throat scratchiness, throat tightness, shortness of breath, wheezing, difficulty breathing, chest pain, abdominal pain, nausea, vomiting, or diarrhea.  Patient states that with his previous injections in his abdomen, he has also noticed the same or similar reaction.  Patient states that he has been taking Benadryl for his symptoms.  Patient states he is on Mounjaro for weight loss.  Patient reports his most recent hemoglobin A1c was 7.2.  Past Medical History:  Diagnosis Date   Allergy    Seasonal but take Allegra daily   Back pain    Elevated LFTs    Fatty liver    GERD (gastroesophageal reflux disease)    H/O multiple allergies    Heartburn    Hemochromatosis    HTN (hypertension)    white coat component   Joint pain    Migraines    OSA on CPAP    severe OSA with AHI 92/hr by sleep study 2014 on CPAP   Prediabetes    Sleep apnea     Patient Active Problem List   Diagnosis Date Noted   Allergic rhinitis 05/04/2022   Disorder of salivary gland 05/04/2022   Fatty liver 05/04/2022   Gastroesophageal reflux disease 05/04/2022   History of adenomatous polyp of colon 05/04/2022   Migraine 05/04/2022   Essential hypertension 05/04/2022   Morbid obesity ([name]) 05/04/2022   Obstructive sleep apnea 05/04/2022   Type 2 diabetes mellitus with other specified complication ([name]) 05/04/2022    Type 2 diabetes mellitus with hyperglycemia, without long-term current use of insulin ([name]) 04/30/2022   Class 3 severe obesity with serious comorbidity and body mass index (BMI) of 40.0 to 44.9 in adult [facility]) 04/30/2022   Encounter to establish care with new doctor 04/30/2022   Hemochromatosis    Benign essential HTN 08/07/2018   Obesity (BMI 30-39.9) 08/07/2018   Snoring 08/07/2018   OSA on CPAP     Past Surgical History:  Procedure Laterality Date   none         Home Medications    Prior to Admission medications   Medication Sig Start Date End Date Taking? Authorizing Provider  loratadine (CLARITIN) 10 MG tablet Take 10 mg by mouth daily.   Yes [provider]  triamcinolone cream (KENALOG) 0.1 % Apply 1 Application topically 2 (two) times daily. 07/02/22  Yes Tanisa Lagace-Warren, Sadie Haber, NP  acetaminophen (TYLENOL) 500 MG tablet Take 500 mg by mouth every 6 (six) hours as needed.    [provider]  empagliflozin (JARDIANCE) 10 MG TABS tablet 1 tablet Orally Once a day for diabetes for 30 days 03/08/22   [provider]  fexofenadine (ALLEGRA ODT) 30 MG disintegrating tablet Take 30 mg by mouth daily.    [provider]  lisinopril (PRINIVIL,ZESTRIL) 20 MG tablet Take 20 mg by mouth  daily.    [provider]  metFORMIN (GLUCOPHAGE-XR) 500 MG 24 hr tablet Take 1,000 mg by mouth daily. 03/14/22   [provider]  metoCLOPramide (REGLAN) 10 MG tablet Take 10 mg by mouth every 6 (six) hours as needed.     [provider]  omeprazole (PRILOSEC) 40 MG capsule Take 40 mg by mouth daily.    [provider]  ondansetron (ZOFRAN) 4 MG tablet Take 4 mg by mouth every 8 (eight) hours as needed for nausea or vomiting.    [provider]  rosuvastatin (CRESTOR) 10 MG tablet Take 1 tablet (10 mg total) by mouth every 3 (three) days. 06/15/22   Park Meo, FNP  tirzepatide [facility]) 7.5 MG/0.5ML Pen Inject 7.5 mg into  the skin once a week. 06/21/22   Park Meo, FNP  Vitamin D, Ergocalciferol, (DRISDOL) 1.25 MG (50000 UNIT) CAPS capsule TAKE 1 CAPSULE (50,000 UNITS TOTAL) BY MOUTH EVERY 7 (SEVEN) DAYS 05/12/20   Langston Reusing, MD    Family History Family History  Problem Relation Age of Onset   Lymphoma Mother    Cancer Mother    Early death Mother    Hypertension Father    Diabetes Father    Obesity Father     Social History Social History   Tobacco Use   Smoking status: Former    Packs/day: 1.00    Years: 10.00    Additional pack years: 0.00    Total pack years: 10.00    Types: Cigarettes    Quit date: 03/08/1993    Years since quitting: 29.3   Smokeless tobacco: Never  Substance Use Topics   Alcohol use: Yes    Alcohol/week: 12.0 standard drinks of alcohol    Types: 12 Shots of liquor per week   Drug use: Never     Allergies   Patient has no known allergies.   Review of Systems Review of Systems Per HPI  Physical Exam Triage Vital Signs ED Triage Vitals [07/02/22 1652]  Enc Vitals Group     BP 133/89     Pulse Rate 90     Resp 18     Temp 98.2 F (36.8 C)     Temp Source Oral     SpO2 98 %     Weight      Height      Head Circumference      Peak Flow      Pain Score 0     Pain Loc      Pain Edu?      Excl. in GC?    No data found.  Updated Vital Signs BP 133/89 (BP Location: Right Arm)   Pulse 90   Temp 98.2 F (36.8 C) (Oral)   Resp 18   SpO2 98%   Visual Acuity Right Eye Distance:   Left Eye Distance:   Bilateral Distance:    Right Eye Near:   Left Eye Near:    Bilateral Near:     Physical Exam Vitals and nursing note reviewed.  Constitutional:      General: He is not in acute distress.    Appearance: Normal appearance.  HENT:     Head: Normocephalic.  Eyes:     Extraocular Movements: Extraocular movements intact.     Pupils: Pupils are equal, round, and reactive to light.  Cardiovascular:     Rate and Rhythm: Normal rate and  regular rhythm.     Pulses: Normal pulses.  Heart sounds: Normal heart sounds.  Pulmonary:     Effort: Pulmonary effort is normal. No respiratory distress.     Breath sounds: Normal breath sounds. No stridor. No wheezing, rhonchi or rales.  Abdominal:     General: Bowel sounds are normal.     Palpations: Abdomen is soft.     Tenderness: There is no abdominal tenderness.  Musculoskeletal:     Cervical back: Normal range of motion.  Lymphadenopathy:     Cervical: No cervical adenopathy.  Skin:    General: Skin is warm and dry.     Comments: Large area of erythema noted to the posterior aspect of the right upper extremity.  Area measures approximately 11 cm in length and 9 cm in diameter.  There is a mild induration noted to the area erythema.  There is no oozing, fluctuance, or drainage present.  Neurological:     General: No focal deficit present.     Mental Status: He is alert and oriented to person, place, and time.  Psychiatric:        Mood and Affect: Mood normal.        Behavior: Behavior normal.      UC Treatments / Results  Labs (all labs ordered are listed, but only abnormal results are displayed) Labs Reviewed - No data to display  EKG   Radiology No results found.  Procedures Procedures (including critical care time)  Medications Ordered in UC Medications - No data to display  Initial Impression / Assessment and Plan / UC Course  I have reviewed the triage vital signs and the nursing notes.  Pertinent labs & imaging results that were available during my care of the patient were reviewed by me and considered in my medical decision making (see chart for details).  The patient is well-appearing, he is in no acute distress, vital signs are stable.  Patient with localized skin reaction as result of the use of Mounjaro.  Because area is localized to the right upper extremity, will treat patient's reaction with triamcinolone cream 0.1%.  Patient advised to  continue use of Benadryl at bedtime, and begin Zyrtec during the daytime.  Supportive care recommendations were provided and discussed with the patient to include cool compresses to the right upper extremity to help with pain and swelling.  Patient advised to discontinue use of Mounjaro until he has followed up with his primary care physician.  Patient advised to follow-up in this clinic or with his PCP if symptoms do not improve.  Patient is in agreement with this plan of care and verbalizes understanding.  All questions were answered.  Patient stable for discharge.   Final Clinical Impressions(s) / UC Diagnoses   Final diagnoses:  Medication reaction, initial encounter     Discharge Instructions      Apply medication as prescribed. May take Zyrtec daily while symptoms persist.  At bedtime, recommend taking Benadryl. Cool compresses to the right upper extremity to help with pain and swelling. Discontinue use of the Mounjaro until you have followed up with your primary care physician or prescribing physician. If the area of redness appears to worsen to include becoming larger, more painful, or swollen, please follow-up in this clinic for further evaluation. Follow-up as needed.     ED Prescriptions     Medication Sig Dispense Auth. Provider   triamcinolone cream (KENALOG) 0.1 % Apply 1 Application topically 2 (two) times daily. 45 g Apryll Hinkle-Warren, Sadie Haber, NP      PDMP not  reviewed this encounter.   Abran Cantor, NP 07/02/22 1715

## 2022-07-02 NOTE — ED Triage Notes (Signed)
Pt c/o medication reaction to [facility] pt first noticed tomorrow large red spot on right arm, last 3 injections have all had reactions pt tolerated 2.5, 5.0 7.5 is intolerable.    Pt has taken 2 benadryl at 2:00 pm

## 2022-07-02 NOTE — Discharge Instructions (Addendum)
Apply medication as prescribed. May take Zyrtec daily while symptoms persist.  At bedtime, recommend taking Benadryl. Cool compresses to the right upper extremity to help with pain and swelling. Discontinue use of the Mounjaro until you have followed up with your primary care physician or prescribing physician. If the area of redness appears to worsen to include becoming larger, more painful, or swollen, please follow-up in this clinic for further evaluation. Follow-up as needed.

## 2022-07-05 ENCOUNTER — Other Ambulatory Visit: Payer: Self-pay | Admitting: Family Medicine

## 2022-07-05 ENCOUNTER — Encounter: Payer: Self-pay | Admitting: Family Medicine

## 2022-07-05 MED ORDER — RYBELSUS 3 MG PO TABS: 3.0000 mg | ORAL_TABLET | Freq: Every day | ORAL | 0 refills | Status: DC

## 2022-07-06 ENCOUNTER — Ambulatory Visit (INDEPENDENT_AMBULATORY_CARE_PROVIDER_SITE_OTHER): Payer: No Typology Code available for payment source | Admitting: Family Medicine

## 2022-07-06 ENCOUNTER — Encounter: Payer: Self-pay | Admitting: Family Medicine

## 2022-07-06 VITALS — BP 122/82 | HR 102 | Temp 99.1°F | Ht 67.0 in | Wt 247.0 lb

## 2022-07-06 DIAGNOSIS — Z7985 Long-term (current) use of injectable non-insulin antidiabetic drugs: Secondary | ICD-10-CM | POA: Diagnosis not present

## 2022-07-06 DIAGNOSIS — E1165 Type 2 diabetes mellitus with hyperglycemia: Secondary | ICD-10-CM

## 2022-07-06 MED ORDER — SEMAGLUTIDE(0.25 OR 0.5MG/DOS) 2 MG/3ML ~~LOC~~ SOPN: 0.2500 mg | PEN_INJECTOR | SUBCUTANEOUS | 0 refills | Status: DC

## 2022-07-06 NOTE — Assessment & Plan Note (Signed)
Injection site reaction with Mounjaro. Will start Ozempic 0.25mg  SQ weekly for 4 weeks then increase to 0.5mg  weekly.

## 2022-07-06 NOTE — Progress Notes (Signed)
   Acute Office Visit  Subjective:     Patient ID: Antonio Hanson, male    DOB: March 20, 1965, 57 y.o.   MRN: [id number]  Chief Complaint  Patient presents with   Follow-up    Discuss meds    HPI Patient is in today for medication management. He has been taking Mounjaro for some time and recently began having injection site reactions that included redness at the site. He denies generalized rash, drainage/pus/edema at injection site, swelling of face of lips, dyspnea or shortness of breath. He is taking Claritin daily and has tried rotating injection sites. Discussed medication options today in office and would like to try Ozempic as Rybelsus is very expensive.   Review of Systems  All other systems reviewed and are negative.       Objective:    BP 122/82   Pulse (!) 102   Temp 99.1 F (37.3 C) (Oral)   Ht 5\' 7"  (1.702 m)   Wt 247 lb (112 kg)   SpO2 97%   BMI 38.69 kg/m    Physical Exam Vitals and nursing note reviewed.  Constitutional:      Appearance: Normal appearance. He is normal weight.  HENT:     Head: Normocephalic and atraumatic.  Skin:    General: Skin is warm and dry.     Capillary Refill: Capillary refill takes less than 2 seconds.  Neurological:     General: No focal deficit present.     Mental Status: He is alert and oriented to person, place, and time. Mental status is at baseline.  Psychiatric:        Mood and Affect: Mood normal.        Behavior: Behavior normal.        Thought Content: Thought content normal.        Judgment: Judgment normal.     No results found for any visits on 07/06/22.      Assessment & Plan:   Problem List Items Addressed This Visit     Type 2 diabetes mellitus with hyperglycemia, without long-term current use of insulin ([name]) - Primary    Injection site reaction with Mounjaro. Will start Ozempic 0.25mg  SQ weekly for 4 weeks then increase to 0.5mg  weekly.      Relevant Medications   Semaglutide,0.25 or 0.5MG /DOS,  2 MG/3ML SOPN    Meds ordered this encounter  Medications   Semaglutide,0.25 or 0.5MG /DOS, 2 MG/3ML SOPN    Sig: Inject 0.25 mg into the skin once a week.    Dispense:  3 mL    Refill:  0    Order Specific Question:   Supervising Provider    Answer:   Lynnea Ferrier T [[id number]]    Return if symptoms worsen or fail to improve.  Park Meo, FNP

## 2022-08-03 ENCOUNTER — Other Ambulatory Visit: Payer: Self-pay | Admitting: Family Medicine

## 2022-08-03 MED ORDER — SEMAGLUTIDE(0.25 OR 0.5MG/DOS) 2 MG/3ML ~~LOC~~ SOPN: 0.5000 mg | PEN_INJECTOR | SUBCUTANEOUS | 0 refills | Status: AC

## 2022-08-08 ENCOUNTER — Encounter: Payer: Self-pay | Admitting: Family Medicine

## 2022-08-23 ENCOUNTER — Other Ambulatory Visit: Payer: Self-pay | Admitting: Family Medicine

## 2022-08-23 DIAGNOSIS — K760 Fatty (change of) liver, not elsewhere classified: Secondary | ICD-10-CM

## 2022-08-23 DIAGNOSIS — E66813 Obesity, class 3: Secondary | ICD-10-CM

## 2022-08-23 DIAGNOSIS — E1165 Type 2 diabetes mellitus with hyperglycemia: Secondary | ICD-10-CM

## 2022-08-24 ENCOUNTER — Encounter: Payer: Self-pay | Admitting: Family Medicine

## 2022-08-24 ENCOUNTER — Other Ambulatory Visit: Payer: Self-pay

## 2022-08-24 DIAGNOSIS — E1165 Type 2 diabetes mellitus with hyperglycemia: Secondary | ICD-10-CM

## 2022-08-24 DIAGNOSIS — K760 Fatty (change of) liver, not elsewhere classified: Secondary | ICD-10-CM

## 2022-08-24 MED ORDER — SEMAGLUTIDE (1 MG/DOSE) 4 MG/3ML ~~LOC~~ SOPN
1.0000 mg | PEN_INJECTOR | SUBCUTANEOUS | 1 refills | Status: DC
Start: 2022-08-24 — End: 2022-09-21

## 2022-08-29 ENCOUNTER — Other Ambulatory Visit: Payer: Self-pay | Admitting: Family Medicine

## 2022-08-30 MED ORDER — ONDANSETRON HCL 4 MG PO TABS: 4.0000 mg | ORAL_TABLET | Freq: Three times a day (TID) | ORAL | 1 refills | Status: DC | PRN

## 2022-09-19 ENCOUNTER — Encounter: Payer: Self-pay | Admitting: Family Medicine

## 2022-09-21 ENCOUNTER — Other Ambulatory Visit: Payer: Self-pay | Admitting: Family Medicine

## 2022-09-21 DIAGNOSIS — K760 Fatty (change of) liver, not elsewhere classified: Secondary | ICD-10-CM

## 2022-09-21 DIAGNOSIS — E1165 Type 2 diabetes mellitus with hyperglycemia: Secondary | ICD-10-CM

## 2022-09-21 MED ORDER — SEMAGLUTIDE (2 MG/DOSE) 8 MG/3ML ~~LOC~~ SOPN: 2.0000 mg | PEN_INJECTOR | SUBCUTANEOUS | 1 refills | Status: DC

## 2022-09-30 ENCOUNTER — Encounter: Payer: Self-pay | Admitting: Family Medicine

## 2022-09-30 ENCOUNTER — Ambulatory Visit (INDEPENDENT_AMBULATORY_CARE_PROVIDER_SITE_OTHER): Payer: No Typology Code available for payment source | Admitting: Family Medicine

## 2022-09-30 ENCOUNTER — Other Ambulatory Visit: Payer: Self-pay

## 2022-09-30 VITALS — BP 130/88 | HR 97 | Temp 98.7°F | Ht 67.0 in | Wt 247.0 lb

## 2022-09-30 DIAGNOSIS — I10 Essential (primary) hypertension: Secondary | ICD-10-CM

## 2022-09-30 DIAGNOSIS — Z125 Encounter for screening for malignant neoplasm of prostate: Secondary | ICD-10-CM

## 2022-09-30 DIAGNOSIS — E1169 Type 2 diabetes mellitus with other specified complication: Secondary | ICD-10-CM

## 2022-09-30 MED ORDER — FREESTYLE LIBRE 3 SENSOR MISC: 1 refills | Status: DC

## 2022-09-30 NOTE — Assessment & Plan Note (Signed)
Well controlledat home, known white coat. Continue Lisinopril 20mg  daily. Denies chest pain, palpitations, shortness of breath, headaches, vision changes, lightheadedness/dizziness and will seek medical care if symptoms occur.

## 2022-09-30 NOTE — Progress Notes (Signed)
Subjective:  HPI: Antonio Hanson is a 57 y.o. male presenting on 09/30/2022 for Follow-up (5 month f/u/labs to check A1C and CMP/)   HPI Patient is in today for chronic conditions follow up with labs. He has not had labs drawn prior today.  DIABETES Hypoglycemic episodes:no Polydipsia/polyuria: no Visual disturbance: no Chest pain: no Paresthesias: no Glucose Monitoring: yes  Accucheck frequency: Daily  Fasting glucose:81-137  Post prandial:  Evening:  Before meals: Taking Insulin?: no  Long acting insulin:  Short acting insulin: Blood Pressure Monitoring: not checking Retinal Examination: Up to Date Foot Exam:  today Diabetic Education: Completed Pneumovax:  declines Influenza:  declines Aspirin: no   Review of Systems  All other systems reviewed and are negative.   Relevant past medical history reviewed and updated as indicated.   Past Medical History:  Diagnosis Date   Allergy    Seasonal but take Allegra daily   Back pain    Elevated LFTs    Fatty liver    GERD (gastroesophageal reflux disease)    H/O multiple allergies    Heartburn    Hemochromatosis    HTN (hypertension)    white coat component   Joint pain    Migraines    OSA on CPAP    severe OSA with AHI 92/hr by sleep study 2014 on CPAP   Prediabetes    Sleep apnea      Past Surgical History:  Procedure Laterality Date   none      Allergies and medications reviewed and updated.   Current Outpatient Medications:    acetaminophen (TYLENOL) 500 MG tablet, Take 500 mg by mouth every 6 (six) hours as needed., Disp: , Rfl:    fexofenadine (ALLEGRA ODT) 30 MG disintegrating tablet, Take 30 mg by mouth daily., Disp: , Rfl:    lisinopril (PRINIVIL,ZESTRIL) 20 MG tablet, Take 20 mg by mouth daily., Disp: , Rfl:    metoCLOPramide (REGLAN) 10 MG tablet, Take 10 mg by mouth every 6 (six) hours as needed. , Disp: , Rfl:    omeprazole (PRILOSEC) 40 MG capsule, Take 40 mg by mouth daily., Disp: ,  Rfl:    ondansetron (ZOFRAN) 4 MG tablet, Take 1 tablet (4 mg total) by mouth every 8 (eight) hours as needed for nausea or vomiting., Disp: 20 tablet, Rfl: 1   rosuvastatin (CRESTOR) 10 MG tablet, TAKE 1 TABLET (10 MG TOTAL) BY MOUTH EVERY 3 (THREE) DAYS., Disp: 30 tablet, Rfl: 1   Semaglutide, 2 MG/DOSE, 8 MG/3ML SOPN, Inject 2 mg as directed once a week., Disp: 3 mL, Rfl: 1   loratadine (CLARITIN) 10 MG tablet, Take 10 mg by mouth daily., Disp: , Rfl:    metFORMIN (GLUCOPHAGE-XR) 500 MG 24 hr tablet, Take 1,000 mg by mouth daily., Disp: , Rfl:    triamcinolone cream (KENALOG) 0.1 %, Apply 1 Application topically 2 (two) times daily., Disp: 45 g, Rfl: 0   Vitamin D, Ergocalciferol, (DRISDOL) 1.25 MG (50000 UNIT) CAPS capsule, TAKE 1 CAPSULE (50,000 UNITS TOTAL) BY MOUTH EVERY 7 (SEVEN) DAYS, Disp: 4 capsule, Rfl: 0  Allergies  Allergen Reactions   Mounjaro [Tirzepatide] Other (See Comments)    Injection site reaction    Objective:   BP 130/88   Pulse 97   Temp 98.7 F (37.1 C) (Oral)   Ht 5\' 7"  (1.702 m)   Wt 247 lb (112 kg)   SpO2 100%   BMI 38.69 kg/m      09/30/2022  2:03 PM 07/06/2022   11:08 AM 07/02/2022    4:52 PM  Vitals with BMI  Height 5\' 7"  5\' 7"    Weight 247 lbs 247 lbs   BMI 38.68 38.68   Systolic 130 122 132  Diastolic 88 82 89  Pulse 97 102 90     Physical Exam Vitals and nursing note reviewed.  Constitutional:      Appearance: Normal appearance. He is normal weight.  HENT:     Head: Normocephalic and atraumatic.  Cardiovascular:     Rate and Rhythm: Normal rate and regular rhythm.     Pulses: Normal pulses.     Heart sounds: Normal heart sounds.  Pulmonary:     Effort: Pulmonary effort is normal.     Breath sounds: Normal breath sounds.  Skin:    General: Skin is warm and dry.     Capillary Refill: Capillary refill takes less than 2 seconds.  Neurological:     General: No focal deficit present.     Mental Status: He is alert and oriented to  person, place, and time. Mental status is at baseline.  Psychiatric:        Mood and Affect: Mood normal.        Behavior: Behavior normal.        Thought Content: Thought content normal.        Judgment: Judgment normal.     Assessment & Plan:  Essential hypertension Assessment & Plan: Well controlledat home, known white coat. Continue Lisinopril 20mg  daily. Denies chest pain, palpitations, shortness of breath, headaches, vision changes, lightheadedness/dizziness and will seek medical care if symptoms occur.   Orders: -     CBC with Differential/Platelet -     COMPLETE METABOLIC PANEL WITH GFR  Type 2 diabetes mellitus with other specified complication, without long-term current use of insulin ([name]) Assessment & Plan: Well controlled. Just increased Ozempic to 2mg  weekly this week. A1c, uACR, foot exam done today. Has upcoming appt with ophthalmology. Continue to monitor BG at home. CGM placed today. Follow up in 6 months.  Orders: -     Microalbumin / creatinine urine ratio; Future -     Hemoglobin A1c; Future -     CBC with Differential/Platelet -     COMPLETE METABOLIC PANEL WITH GFR  Prostate cancer screening -     PSA     Follow up plan: Return in about 6 months (around 04/02/2023) for annual physical with labs 1 week prior.  Park Meo, FNP

## 2022-09-30 NOTE — Assessment & Plan Note (Addendum)
Well controlled. Just increased Ozempic to 2mg  weekly this week. A1c, uACR, foot exam done today. Has upcoming appt with ophthalmology. Continue to monitor BG at home. CGM placed today. Follow up in 6 months.

## 2022-10-01 ENCOUNTER — Other Ambulatory Visit: Payer: Self-pay

## 2022-10-01 DIAGNOSIS — E1165 Type 2 diabetes mellitus with hyperglycemia: Secondary | ICD-10-CM

## 2022-10-01 LAB — COMPLETE METABOLIC PANEL WITH GFR

## 2022-10-01 MED ORDER — FREESTYLE LIBRE 3 SENSOR MISC
3 refills | Status: DC
Start: 2022-10-01 — End: 2022-11-09

## 2022-10-28 LAB — HM DIABETES EYE EXAM

## 2022-11-09 ENCOUNTER — Other Ambulatory Visit: Payer: Self-pay | Admitting: Family Medicine

## 2022-11-09 ENCOUNTER — Encounter: Payer: Self-pay | Admitting: Family Medicine

## 2022-11-09 DIAGNOSIS — E1165 Type 2 diabetes mellitus with hyperglycemia: Secondary | ICD-10-CM

## 2022-11-09 DIAGNOSIS — K760 Fatty (change of) liver, not elsewhere classified: Secondary | ICD-10-CM

## 2022-11-09 MED ORDER — FREESTYLE LIBRE 3 SENSOR MISC
3 refills | Status: DC
Start: 2022-11-09 — End: 2022-11-16

## 2022-11-09 MED ORDER — ROSUVASTATIN CALCIUM 10 MG PO TABS
10.0000 mg | ORAL_TABLET | ORAL | 1 refills | Status: DC
Start: 2022-11-09 — End: 2023-02-07

## 2022-11-16 ENCOUNTER — Encounter: Payer: Self-pay | Admitting: Family Medicine

## 2022-11-16 ENCOUNTER — Telehealth: Payer: Self-pay | Admitting: Family Medicine

## 2022-11-16 DIAGNOSIS — E1165 Type 2 diabetes mellitus with hyperglycemia: Secondary | ICD-10-CM

## 2022-11-16 MED ORDER — FREESTYLE LIBRE 3 SENSOR MISC
3 refills | Status: DC
Start: 2022-11-16 — End: 2023-06-27

## 2022-11-16 NOTE — Telephone Encounter (Signed)
Prescription Request  11/16/2022  LOV: 09/30/2022  What is the name of the medication or equipment?   Continuous Glucose Sensor (FREESTYLE LIBRE 3 SENSOR) MISC [[id number]]  **Fax from pharmacy states: Missing/illegible information on Rx. Libre 3 not available from manufacturer..they will be releasing a LIBRE 3 PLUS at some point**  Have you contacted your pharmacy to request a refill? Yes   Which pharmacy would you like this sent to?    Patient notified that their request is being sent to the clinical staff for review and that they should receive a response within 2 business days.   Please advise pharmacist.

## 2022-11-17 ENCOUNTER — Other Ambulatory Visit: Payer: Self-pay | Admitting: Family Medicine

## 2022-11-17 ENCOUNTER — Other Ambulatory Visit: Payer: Self-pay

## 2022-11-17 ENCOUNTER — Other Ambulatory Visit (HOSPITAL_COMMUNITY): Payer: Self-pay

## 2022-11-17 DIAGNOSIS — E1165 Type 2 diabetes mellitus with hyperglycemia: Secondary | ICD-10-CM

## 2022-11-17 MED ORDER — FREESTYLE LIBRE 3 SENSOR MISC
3 refills | Status: DC
  Filled 2022-11-17: qty 2, 28d supply, fill #0

## 2022-11-17 NOTE — Telephone Encounter (Signed)
Received eFax from pharmacy to follow up on refill requested for Continuous Glucose Sensor (FREESTYLE LIBRE 3 SENSOR) MISC  Item on backorder.  Pharmacy:  [id number] [place], [place] - [address] [place] [address] Phone: [phone]  Fax: [phone] DEA #: [id number]   Please advise pharmacist.

## 2022-11-18 ENCOUNTER — Other Ambulatory Visit (HOSPITAL_COMMUNITY): Payer: Self-pay

## 2022-11-18 ENCOUNTER — Other Ambulatory Visit: Payer: Self-pay | Admitting: Family Medicine

## 2022-11-18 MED ORDER — FREESTYLE LIBRE 3 SENSOR MISC
3 refills | Status: DC
  Filled 2022-11-18: qty 2, 28d supply, fill #0
  Filled 2022-12-13: qty 2, 28d supply, fill #1
  Filled 2023-01-08: qty 2, 28d supply, fill #2
  Filled 2023-02-05: qty 2, 28d supply, fill #3

## 2022-12-14 ENCOUNTER — Other Ambulatory Visit (HOSPITAL_COMMUNITY): Payer: Self-pay

## 2022-12-27 ENCOUNTER — Other Ambulatory Visit: Payer: Self-pay | Admitting: Family Medicine

## 2023-01-10 ENCOUNTER — Other Ambulatory Visit (HOSPITAL_COMMUNITY): Payer: Self-pay

## 2023-01-24 ENCOUNTER — Other Ambulatory Visit: Payer: Self-pay | Admitting: Family Medicine

## 2023-01-26 ENCOUNTER — Encounter: Payer: Self-pay | Admitting: Family Medicine

## 2023-01-26 ENCOUNTER — Other Ambulatory Visit: Payer: Self-pay

## 2023-01-26 DIAGNOSIS — I10 Essential (primary) hypertension: Secondary | ICD-10-CM

## 2023-01-26 MED ORDER — LISINOPRIL 20 MG PO TABS: 20.0000 mg | ORAL_TABLET | Freq: Every day | ORAL | 1 refills | Status: DC

## 2023-01-27 NOTE — Telephone Encounter (Signed)
Telephone call  

## 2023-02-05 ENCOUNTER — Other Ambulatory Visit: Payer: Self-pay | Admitting: Family Medicine

## 2023-02-05 DIAGNOSIS — E1165 Type 2 diabetes mellitus with hyperglycemia: Secondary | ICD-10-CM

## 2023-02-05 DIAGNOSIS — K760 Fatty (change of) liver, not elsewhere classified: Secondary | ICD-10-CM

## 2023-02-08 ENCOUNTER — Other Ambulatory Visit (HOSPITAL_COMMUNITY): Payer: Self-pay

## 2023-02-15 ENCOUNTER — Other Ambulatory Visit: Payer: Self-pay | Admitting: Family Medicine

## 2023-02-15 ENCOUNTER — Encounter: Payer: Self-pay | Admitting: Family Medicine

## 2023-02-15 ENCOUNTER — Other Ambulatory Visit: Payer: Self-pay

## 2023-02-15 DIAGNOSIS — E1165 Type 2 diabetes mellitus with hyperglycemia: Secondary | ICD-10-CM

## 2023-02-15 MED ORDER — OZEMPIC (2 MG/DOSE) 8 MG/3ML ~~LOC~~ SOPN: 2.0000 mg | PEN_INJECTOR | SUBCUTANEOUS | 3 refills | Status: DC

## 2023-03-04 ENCOUNTER — Encounter: Payer: Self-pay | Admitting: Family Medicine

## 2023-03-07 ENCOUNTER — Other Ambulatory Visit: Payer: Self-pay | Admitting: Family Medicine

## 2023-03-07 DIAGNOSIS — G4733 Obstructive sleep apnea (adult) (pediatric): Secondary | ICD-10-CM

## 2023-03-14 ENCOUNTER — Other Ambulatory Visit: Payer: Self-pay | Admitting: Family Medicine

## 2023-03-14 ENCOUNTER — Encounter: Payer: Self-pay | Admitting: Family Medicine

## 2023-03-14 DIAGNOSIS — E1165 Type 2 diabetes mellitus with hyperglycemia: Secondary | ICD-10-CM

## 2023-03-20 ENCOUNTER — Other Ambulatory Visit: Payer: Self-pay | Admitting: Family Medicine

## 2023-03-22 ENCOUNTER — Other Ambulatory Visit (HOSPITAL_BASED_OUTPATIENT_CLINIC_OR_DEPARTMENT_OTHER): Payer: Self-pay

## 2023-03-22 ENCOUNTER — Other Ambulatory Visit (HOSPITAL_COMMUNITY): Payer: Self-pay

## 2023-03-22 MED ORDER — FREESTYLE LIBRE 3 SENSOR MISC
3 refills | Status: DC
  Filled 2023-03-22: qty 2, 28d supply, fill #0
  Filled 2023-04-10 – 2023-04-12 (×3): qty 2, 28d supply, fill #1
  Filled 2023-05-08: qty 2, 28d supply, fill #2
  Filled 2023-06-03: qty 2, 28d supply, fill #3

## 2023-03-22 NOTE — Telephone Encounter (Signed)
 Requested Prescriptions  Pending Prescriptions Disp Refills   Continuous Glucose Sensor (FREESTYLE LIBRE 3 SENSOR) MISC 2 each 3    Sig: Place 1 sensor on the skin every 14 days. Use to check glucose continuously     Endocrinology: Diabetes - Testing Supplies Failed - 03/22/2023 10:17 AM      Failed - Valid encounter within last 12 months    Recent Outpatient Visits   None     Future Appointments             In 2 weeks Kayla Jeoffrey RAMAN, FNP [facility]

## 2023-03-23 ENCOUNTER — Encounter: Payer: Self-pay | Admitting: Pharmacist

## 2023-03-23 ENCOUNTER — Other Ambulatory Visit (HOSPITAL_COMMUNITY): Payer: Self-pay

## 2023-03-23 ENCOUNTER — Other Ambulatory Visit: Payer: Self-pay

## 2023-03-29 ENCOUNTER — Other Ambulatory Visit: Payer: Self-pay | Admitting: Family Medicine

## 2023-03-29 DIAGNOSIS — I10 Essential (primary) hypertension: Secondary | ICD-10-CM

## 2023-03-31 ENCOUNTER — Other Ambulatory Visit: Payer: No Typology Code available for payment source

## 2023-03-31 DIAGNOSIS — Z13220 Encounter for screening for lipoid disorders: Secondary | ICD-10-CM

## 2023-03-31 DIAGNOSIS — K760 Fatty (change of) liver, not elsewhere classified: Secondary | ICD-10-CM

## 2023-03-31 DIAGNOSIS — E66813 Obesity, class 3: Secondary | ICD-10-CM

## 2023-03-31 DIAGNOSIS — E1165 Type 2 diabetes mellitus with hyperglycemia: Secondary | ICD-10-CM

## 2023-03-31 DIAGNOSIS — E669 Obesity, unspecified: Secondary | ICD-10-CM

## 2023-03-31 DIAGNOSIS — I10 Essential (primary) hypertension: Secondary | ICD-10-CM

## 2023-03-31 DIAGNOSIS — Z125 Encounter for screening for malignant neoplasm of prostate: Secondary | ICD-10-CM

## 2023-04-01 LAB — COMPLETE METABOLIC PANEL WITH GFR
AG Ratio: 2.2 (calc) (ref 1.0–2.5)
ALT: 41 U/L (ref 9–46)
AST: 26 U/L (ref 10–35)
Albumin: 4.6 g/dL (ref 3.6–5.1)
Alkaline phosphatase (APISO): 91 U/L (ref 35–144)
BUN: 16 mg/dL (ref 7–25)
CO2: 25 mmol/L (ref 20–32)
Calcium: 9.5 mg/dL (ref 8.6–10.3)
Chloride: 100 mmol/L (ref 98–110)
Creat: 0.81 mg/dL (ref 0.70–1.30)
Globulin: 2.1 g/dL (ref 1.9–3.7)
Glucose, Bld: 93 mg/dL (ref 65–99)
Potassium: 4.2 mmol/L (ref 3.5–5.3)
Sodium: 138 mmol/L (ref 135–146)
Total Bilirubin: 0.5 mg/dL (ref 0.2–1.2)
Total Protein: 6.7 g/dL (ref 6.1–8.1)
eGFR: 102 mL/min/{1.73_m2} (ref >=60)

## 2023-04-01 LAB — CBC WITH DIFFERENTIAL/PLATELET
Absolute Lymphocytes: 2949 {cells}/uL (ref 850–3900)
Absolute Monocytes: 714 {cells}/uL (ref 200–950)
Basophils Absolute: 68 {cells}/uL (ref 0–200)
Basophils Relative: 0.9 %
Eosinophils Absolute: 53 {cells}/uL (ref 15–500)
Eosinophils Relative: 0.7 %
HCT: 52.9 % — ABNORMAL HIGH (ref 38.5–50.0)
Hemoglobin: 17.9 g/dL — ABNORMAL HIGH (ref 13.2–17.1)
MCH: 30.1 pg (ref 27.0–33.0)
MCHC: 33.8 g/dL (ref 32.0–36.0)
MCV: 88.9 fL (ref 80.0–100.0)
MPV: 10.4 fL (ref 7.5–12.5)
Monocytes Relative: 9.4 %
Neutro Abs: 3815 {cells}/uL (ref 1500–7800)
Neutrophils Relative %: 50.2 %
Platelets: 241 10*3/uL (ref 140–400)
RBC: 5.95 10*6/uL — ABNORMAL HIGH (ref 4.20–5.80)
RDW: 12.7 % (ref 11.0–15.0)
Total Lymphocyte: 38.8 %
WBC: 7.6 10*3/uL (ref 3.8–10.8)

## 2023-04-01 LAB — PSA: PSA: 1.04 ng/mL (ref <=4.00)

## 2023-04-01 LAB — HEMOGLOBIN A1C
Hgb A1c MFr Bld: 5.5 %{Hb} (ref <5.7)
Mean Plasma Glucose: 111 mg/dL
eAG (mmol/L): 6.2 mmol/L

## 2023-04-01 LAB — LIPID PANEL
Cholesterol: 137 mg/dL (ref <200)
HDL: 56 mg/dL (ref >=40)
LDL Cholesterol (Calc): 62 mg/dL
Non-HDL Cholesterol (Calc): 81 mg/dL (ref <130)
Total CHOL/HDL Ratio: 2.4 (calc) (ref <5.0)
Triglycerides: 108 mg/dL (ref <150)

## 2023-04-07 ENCOUNTER — Encounter: Payer: Self-pay | Admitting: Family Medicine

## 2023-04-07 ENCOUNTER — Ambulatory Visit (INDEPENDENT_AMBULATORY_CARE_PROVIDER_SITE_OTHER): Payer: No Typology Code available for payment source | Admitting: Family Medicine

## 2023-04-07 VITALS — BP 122/80 | HR 97 | Temp 97.8°F | Ht 67.0 in | Wt 239.0 lb

## 2023-04-07 DIAGNOSIS — Z7985 Long-term (current) use of injectable non-insulin antidiabetic drugs: Secondary | ICD-10-CM

## 2023-04-07 DIAGNOSIS — I10 Essential (primary) hypertension: Secondary | ICD-10-CM

## 2023-04-07 DIAGNOSIS — G4733 Obstructive sleep apnea (adult) (pediatric): Secondary | ICD-10-CM

## 2023-04-07 DIAGNOSIS — E1165 Type 2 diabetes mellitus with hyperglycemia: Secondary | ICD-10-CM | POA: Diagnosis not present

## 2023-04-07 DIAGNOSIS — Z0001 Encounter for general adult medical examination with abnormal findings: Secondary | ICD-10-CM | POA: Diagnosis not present

## 2023-04-07 DIAGNOSIS — E66813 Obesity, class 3: Secondary | ICD-10-CM

## 2023-04-07 DIAGNOSIS — Z0000 Encounter for general adult medical examination without abnormal findings: Secondary | ICD-10-CM | POA: Insufficient documentation

## 2023-04-07 DIAGNOSIS — Z6841 Body Mass Index (BMI) 40.0 and over, adult: Secondary | ICD-10-CM

## 2023-04-07 MED ORDER — LISINOPRIL 20 MG PO TABS: 10.0000 mg | ORAL_TABLET | Freq: Every day | ORAL | Status: DC

## 2023-04-07 NOTE — Assessment & Plan Note (Signed)

## 2023-04-07 NOTE — Assessment & Plan Note (Signed)
Needs CPAP titration. Referral has been placed, phone number provided today for scheduling.

## 2023-04-07 NOTE — Assessment & Plan Note (Signed)
Well controlled on Ozempic. Continue current regimen. A1c and uACR UTD. Foot exam UTD. Vaccines UTD. Retinal eye exam UTD. Recommend heart healthy diet such as Mediterranean diet with whole grains, fruits, vegetable, fish, lean meats, nuts, and olive oil. Limit salt. Encouraged moderate walking, 3-5 times/week for 30-50 minutes each session. Aim for at least 150 minutes.week. Goal should be pace of 3 miles/hours, or walking 1.5 miles in 30 minutes. Seek medical care for urinary frequency, extreme thirst, vision changes, lightheadedness, dizziness.

## 2023-04-07 NOTE — Assessment & Plan Note (Signed)
Well controlled. OK to trial decrease in Lisinopril to 10mg  daily. He will monitor at home and record daily readings. Return to office if sustains >14090. Follow up in 1 month. Recommend heart healthy diet such as Mediterranean diet with whole grains, fruits, vegetable, fish, lean meats, nuts, and olive oil. Limit salt. Encouraged moderate walking, 3-5 times/week for 30-50 minutes each session. Aim for at least 150 minutes.week. Goal should be pace of 3 miles/hours, or walking 1.5 miles in 30 minutes. Avoid tobacco products. Avoid excess alcohol. Take medications as prescribed and bring medications and blood pressure log with cuff to each office visit. Seek medical care for chest pain, palpitations, shortness of breath with exertion, dizziness/lightheadedness, vision changes, recurrent headaches, or swelling of extremities.

## 2023-04-07 NOTE — Assessment & Plan Note (Signed)
Continue Ozempic. Encouraged to resume exercise and reiterated importance of heart healthy diet.

## 2023-04-07 NOTE — Progress Notes (Signed)
Complete physical exam  Patient: Antonio Hanson   DOB: 08/16/1965   58 y.o. Male  MRN: [id number]  Subjective:    Chief Complaint  Patient presents with   Follow-up    room 2  annual physical      Antonio Hanson is a 58 y.o. male who presents today for a complete physical exam. He reports consuming a general diet. The patient does not participate in regular exercise at present. He generally feels well. He reports sleeping well. He does not have additional problems to discuss today.  Colonoscopy done 2022, repeat 5y. PNA, Tdap, flu previously completed.  Diabetes well controlled on Ozempic 2mg  weekly and he is tolerating well. HTN well controlled on Lisinopril 20mg  daily, he has been on this medication for many years and would like to try to wean.   Most recent fall risk assessment:     No data to display           Most recent depression screenings:    04/07/2023    8:32 AM 07/06/2022   12:03 PM  PHQ 2/9 Scores  PHQ - 2 Score 0 0  PHQ- 9 Score 1 3    Vision:Within last year, Dental: No current dental problems and Receives regular dental care, and PSA: Prostate cancer screening and PSA options (with potential risks and benefits of testing vs. not testing) were discussed along with recent recs/guidelines.   Patient Active Problem List   Diagnosis Date Noted   Physical exam, annual 04/07/2023   Allergic rhinitis 05/04/2022   Disorder of salivary gland 05/04/2022   Fatty liver 05/04/2022   Gastroesophageal reflux disease 05/04/2022   History of adenomatous polyp of colon 05/04/2022   Migraine 05/04/2022   Essential hypertension 05/04/2022   Morbid obesity ([name]) 05/04/2022   Obstructive sleep apnea 05/04/2022   Type 2 diabetes mellitus with other specified complication ([name]) 05/04/2022   Type 2 diabetes mellitus with hyperglycemia, without long-term current use of insulin ([name]) 04/30/2022   Class 3 severe obesity with serious comorbidity and body mass index (BMI) of 40.0  to 44.9 in adult [facility]) 04/30/2022   Encounter to establish care with new doctor 04/30/2022   Hemochromatosis    Benign essential HTN 08/07/2018   Obesity (BMI 30-39.9) 08/07/2018   Snoring 08/07/2018   OSA on CPAP    Past Medical History:  Diagnosis Date   Allergy    Seasonal but take Allegra daily   Back pain    Elevated LFTs    Fatty liver    GERD (gastroesophageal reflux disease)    H/O multiple allergies    Heartburn    Hemochromatosis    HTN (hypertension)    white coat component   Joint pain    Migraines    OSA on CPAP    severe OSA with AHI 92/hr by sleep study 2014 on CPAP   Prediabetes    Sleep apnea    Past Surgical History:  Procedure Laterality Date   none     Social History   Tobacco Use   Smoking status: Former    Current packs/day: 0.00    Average packs/day: 1 pack/day for 10.0 years (10.0 ttl pk-yrs)    Types: Cigarettes    Start date: 03/09/1983    Quit date: 03/08/1993    Years since quitting: 30.1   Smokeless tobacco: Never  Substance Use Topics   Alcohol use: Yes    Alcohol/week: 12.0 standard drinks of alcohol    Types:  12 Shots of liquor per week   Drug use: Never   Family History  Problem Relation Age of Onset   Lymphoma Mother    Cancer Mother    Early death Mother    Hypertension Father    Diabetes Father    Obesity Father    Allergies  Allergen Reactions   Mounjaro [Tirzepatide] Other (See Comments)    Injection site reaction      Patient Care Team: Park Meo, FNP as PCP - General (Family Medicine)   Outpatient Medications Prior to Visit  Medication Sig   acetaminophen (TYLENOL) 500 MG tablet Take 500 mg by mouth every 6 (six) hours as needed.   Cholecalciferol (VITAMIN D3) 50 MCG (2000 UT) capsule Take 2,000 Units by mouth daily.   Continuous Glucose Sensor (FREESTYLE LIBRE 3 SENSOR) MISC Place 1 sensor on the skin every 14 days. Use to check glucose continuously   Continuous Glucose Sensor (FREESTYLE LIBRE 3  SENSOR) MISC Place 1 sensor on the skin every 14 days. Use to check glucose continuously   fexofenadine (ALLEGRA ODT) 30 MG disintegrating tablet Take 30 mg by mouth daily.   metoCLOPramide (REGLAN) 10 MG tablet Take 10 mg by mouth every 6 (six) hours as needed.    omeprazole (PRILOSEC) 40 MG capsule Take 40 mg by mouth daily.   ondansetron (ZOFRAN) 4 MG tablet Take 1 tablet (4 mg total) by mouth every 8 (eight) hours as needed for nausea or vomiting.   rosuvastatin (CRESTOR) 10 MG tablet TAKE 1 TABLET (10 MG TOTAL) BY MOUTH EVERY 3 (THREE) DAYS.   Semaglutide, 2 MG/DOSE, (OZEMPIC, 2 MG/DOSE,) 8 MG/3ML SOPN INJECT 2MG  DOSE UNDER THE SKIN ONCE WEEKLY   [DISCONTINUED] lisinopril (ZESTRIL) 20 MG tablet TAKE 1 TABLET BY MOUTH EVERY DAY   [DISCONTINUED] loratadine (CLARITIN) 10 MG tablet Take 10 mg by mouth daily. (Patient not taking: Reported on 04/07/2023)   [DISCONTINUED] metFORMIN (GLUCOPHAGE-XR) 500 MG 24 hr tablet Take 1,000 mg by mouth daily. (Patient not taking: Reported on 04/07/2023)   [DISCONTINUED] triamcinolone cream (KENALOG) 0.1 % Apply 1 Application topically 2 (two) times daily. (Patient not taking: Reported on 04/07/2023)   [DISCONTINUED] Vitamin D, Ergocalciferol, (DRISDOL) 1.25 MG (50000 UNIT) CAPS capsule TAKE 1 CAPSULE (50,000 UNITS TOTAL) BY MOUTH EVERY 7 (SEVEN) DAYS (Patient not taking: Reported on 04/07/2023)   No facility-administered medications prior to visit.    Review of Systems  Constitutional: Negative.   HENT: Negative.    Eyes: Negative.   Respiratory: Negative.    Cardiovascular: Negative.   Gastrointestinal: Negative.   Genitourinary: Negative.   Musculoskeletal: Negative.   Skin: Negative.   Neurological: Negative.   Endo/Heme/Allergies: Negative.   Psychiatric/Behavioral: Negative.    All other systems reviewed and are negative.         Objective:     BP 122/80   Pulse 97   Temp 97.8 F (36.6 C) (Oral)   Ht 5\' 7"  (1.702 m)   Wt 239 lb (108.4  kg)   SpO2 97%   BMI 37.43 kg/m  BP Readings from Last 3 Encounters:  04/07/23 122/80  09/30/22 130/88  07/06/22 122/82   Wt Readings from Last 3 Encounters:  04/07/23 239 lb (108.4 kg)  09/30/22 247 lb (112 kg)  07/06/22 247 lb (112 kg)      Physical Exam Vitals and nursing note reviewed.  Constitutional:      Appearance: Normal appearance. He is obese.  HENT:     Head:  Normocephalic and atraumatic.     Right Ear: Tympanic membrane, ear canal and external ear normal.     Left Ear: Tympanic membrane, ear canal and external ear normal.     Nose: Nose normal.     Mouth/Throat:     Mouth: Mucous membranes are moist.     Pharynx: Oropharynx is clear.  Eyes:     Extraocular Movements: Extraocular movements intact.     Right eye: Normal extraocular motion and no nystagmus.     Left eye: Normal extraocular motion and no nystagmus.     Conjunctiva/sclera: Conjunctivae normal.     Pupils: Pupils are equal, round, and reactive to light.  Cardiovascular:     Rate and Rhythm: Normal rate and regular rhythm.     Pulses: Normal pulses.     Heart sounds: Normal heart sounds.  Pulmonary:     Effort: Pulmonary effort is normal.     Breath sounds: Normal breath sounds.  Abdominal:     General: Bowel sounds are normal.     Palpations: Abdomen is soft.  Genitourinary:    Comments: Deferred using shared decision making Musculoskeletal:        General: Normal range of motion.     Cervical back: Normal range of motion and neck supple.  Skin:    General: Skin is warm and dry.     Capillary Refill: Capillary refill takes less than 2 seconds.  Neurological:     General: No focal deficit present.     Mental Status: He is alert. Mental status is at baseline.  Psychiatric:        Mood and Affect: Mood normal.        Speech: Speech normal.        Behavior: Behavior normal.        Thought Content: Thought content normal.        Cognition and Memory: Cognition and memory normal.         Judgment: Judgment normal.      No results found for any visits on 04/07/23. Last CBC Lab Results  Component Value Date   WBC 7.6 03/31/2023   HGB 17.9 (H) 03/31/2023   HCT 52.9 (H) 03/31/2023   MCV 88.9 03/31/2023   MCH 30.1 03/31/2023   RDW 12.7 03/31/2023   PLT 241 03/31/2023   Last metabolic panel Lab Results  Component Value Date   GLUCOSE 93 03/31/2023   NA 138 03/31/2023   K 4.2 03/31/2023   CL 100 03/31/2023   CO2 25 03/31/2023   BUN 16 03/31/2023   CREATININE 0.81 03/31/2023   EGFR 102 03/31/2023   CALCIUM 9.5 03/31/2023   PROT 6.7 03/31/2023   ALBUMIN 4.7 01/08/2020   LABGLOB 2.3 10/17/2019   AGRATIO 2.0 10/17/2019   BILITOT 0.5 03/31/2023   ALKPHOS 105 10/17/2019   AST 26 03/31/2023   ALT 41 03/31/2023   Last lipids Lab Results  Component Value Date   CHOL 137 03/31/2023   HDL 56 03/31/2023   LDLCALC 62 03/31/2023   TRIG 108 03/31/2023   CHOLHDL 2.4 03/31/2023   Last hemoglobin A1c Lab Results  Component Value Date   HGBA1C 5.5 03/31/2023   Last thyroid functions Lab Results  Component Value Date   TSH 1.730 10/17/2019   T3TOTAL 121 10/17/2019   T4TOTAL 7.4 10/17/2019   Last vitamin D Lab Results  Component Value Date   VD25OH 37.3 10/17/2019   Last vitamin B12 and Folate Lab Results  Component Value  Date   VITAMINB12 462 10/17/2019   FOLATE 10.2 10/17/2019        Assessment & Plan:    Routine Health Maintenance and Physical Exam  Immunization History  Administered Date(s) Administered   Moderna Sars-Covid-2 Vaccination 05/18/2019, 06/19/2019, 01/21/2020   Td (Adult) 01/08/2004   Tdap 03/18/2011, 02/06/2021   Zoster, Live 08/10/2021, 02/10/2022    Health Maintenance  Topic Date Due   COVID-19 Vaccine (4 - 2024-25 season) 04/23/2023 (Originally 11/07/2022)   INFLUENZA VACCINE  06/06/2023 (Originally 10/07/2022)   Pneumococcal Vaccine 42-56 Years old (1 of 2 - PCV) 04/06/2024 (Originally 07/20/1970)   HEMOGLOBIN A1C   09/28/2023   Diabetic kidney evaluation - Urine ACR  09/30/2023   FOOT EXAM  09/30/2023   OPHTHALMOLOGY EXAM  10/28/2023   Diabetic kidney evaluation - eGFR measurement  03/30/2024   Colonoscopy  06/06/2030   DTaP/Tdap/Td (3 - Td or Tdap) 02/07/2031   HPV VACCINES  Aged Out   Hepatitis C Screening  Discontinued   HIV Screening  Discontinued   Zoster Vaccines- Shingrix  Discontinued    Discussed health benefits of physical activity, and encouraged him to engage in regular exercise appropriate for his age and condition.  Problem List Items Addressed This Visit     OSA on CPAP   Needs CPAP titration. Referral has been placed, phone number provided today for scheduling.      Type 2 diabetes mellitus with hyperglycemia, without long-term current use of insulin ([name])   Well controlled on Ozempic. Continue current regimen. A1c and uACR UTD. Foot exam UTD. Vaccines UTD. Retinal eye exam UTD. Recommend heart healthy diet such as Mediterranean diet with whole grains, fruits, vegetable, fish, lean meats, nuts, and olive oil. Limit salt. Encouraged moderate walking, 3-5 times/week for 30-50 minutes each session. Aim for at least 150 minutes.week. Goal should be pace of 3 miles/hours, or walking 1.5 miles in 30 minutes. Seek medical care for urinary frequency, extreme thirst, vision changes, lightheadedness, dizziness.        Relevant Medications   lisinopril (ZESTRIL) 20 MG tablet   Class 3 severe obesity with serious comorbidity and body mass index (BMI) of 40.0 to 44.9 in adult ([name])   Continue Ozempic. Encouraged to resume exercise and reiterated importance of heart healthy diet.       Essential hypertension   Well controlled. OK to trial decrease in Lisinopril to 10mg  daily. He will monitor at home and record daily readings. Return to office if sustains >14090. Follow up in 1 month. Recommend heart healthy diet such as Mediterranean diet with whole grains, fruits, vegetable, fish, lean  meats, nuts, and olive oil. Limit salt. Encouraged moderate walking, 3-5 times/week for 30-50 minutes each session. Aim for at least 150 minutes.week. Goal should be pace of 3 miles/hours, or walking 1.5 miles in 30 minutes. Avoid tobacco products. Avoid excess alcohol. Take medications as prescribed and bring medications and blood pressure log with cuff to each office visit. Seek medical care for chest pain, palpitations, shortness of breath with exertion, dizziness/lightheadedness, vision changes, recurrent headaches, or swelling of extremities.       Relevant Medications   lisinopril (ZESTRIL) 20 MG tablet   Physical exam, annual - Primary   Today your medical history was reviewed and routine physical exam with labs was performed. Recommend 150 minutes of moderate intensity exercise weekly and consuming a well-balanced diet. Advised to stop smoking if a smoker, avoid smoking if a non-smoker, limit alcohol consumption to 1 drink per  day for women and 2 drinks per day for men, and avoid illicit drug use. Counseled in mental health awareness and when to seek medical care. Vaccine maintenance discussed. Appropriate health maintenance items reviewed. Return to office in 1 year for annual physical exam.       Return in about 4 weeks (around 05/05/2023) for hypertension.     Park Meo, FNP

## 2023-04-11 ENCOUNTER — Other Ambulatory Visit (HOSPITAL_COMMUNITY): Payer: Self-pay

## 2023-04-12 ENCOUNTER — Other Ambulatory Visit (HOSPITAL_COMMUNITY): Payer: Self-pay

## 2023-05-05 ENCOUNTER — Other Ambulatory Visit: Payer: Self-pay

## 2023-05-05 ENCOUNTER — Encounter: Payer: Self-pay | Admitting: Family Medicine

## 2023-05-05 ENCOUNTER — Ambulatory Visit: Payer: No Typology Code available for payment source | Admitting: Family Medicine

## 2023-05-05 DIAGNOSIS — G4733 Obstructive sleep apnea (adult) (pediatric): Secondary | ICD-10-CM

## 2023-05-05 DIAGNOSIS — E669 Obesity, unspecified: Secondary | ICD-10-CM

## 2023-05-05 DIAGNOSIS — K760 Fatty (change of) liver, not elsewhere classified: Secondary | ICD-10-CM

## 2023-05-05 MED ORDER — OMEPRAZOLE 40 MG PO CPDR: 40.0000 mg | DELAYED_RELEASE_CAPSULE | Freq: Every day | ORAL | 1 refills | Status: AC

## 2023-05-09 ENCOUNTER — Encounter: Payer: Self-pay | Admitting: Cardiology

## 2023-05-09 ENCOUNTER — Other Ambulatory Visit: Payer: Self-pay

## 2023-05-09 ENCOUNTER — Ambulatory Visit: Payer: No Typology Code available for payment source | Attending: Cardiology | Admitting: Cardiology

## 2023-05-09 VITALS — BP 122/82 | HR 93 | Ht 67.0 in | Wt 236.2 lb

## 2023-05-09 DIAGNOSIS — I10 Essential (primary) hypertension: Secondary | ICD-10-CM

## 2023-05-09 DIAGNOSIS — G4733 Obstructive sleep apnea (adult) (pediatric): Secondary | ICD-10-CM

## 2023-05-09 NOTE — Patient Instructions (Signed)
 Medication Instructions:  The current medical regimen is effective;  continue present plan and medications.  *If you need a refill on your cardiac medications before your next appointment, please call your pharmacy*  Follow-Up: At [facility], you and your health needs are our priority.  As part of our continuing mission to provide you with exceptional heart care, we have created designated Provider Care Teams.  These Care Teams include your primary Cardiologist (physician) and Advanced Practice Providers (APPs -  Physician Assistants and Nurse Practitioners) who all work together to provide you with the care you need, when you need it.  We recommend signing up for the patient portal called "MyChart".  Sign up information is provided on this After Visit Summary.  MyChart is used to connect with patients for Virtual Visits (Telemedicine).  Patients are able to view lab/test results, encounter notes, upcoming appointments, etc.  Non-urgent messages can be sent to your provider as well.   To learn more about what you can do with MyChart, go to [email or web address].    Your next appointment:   Follow up as instructed.     1st Floor: - Lobby - Registration  - Pharmacy  - Lab - Cafe  2nd Floor: - PV Lab - Diagnostic Testing (echo, CT, nuclear med)  3rd Floor: - Vacant  4th Floor: - TCTS (cardiothoracic surgery) - AFib Clinic - Structural Heart Clinic - Vascular Surgery  - Vascular Ultrasound  5th Floor: - [facility] (general and EP) - Clinical Pharmacy for coumadin, hypertension, lipid, weight-loss medications, and med management appointments    Valet parking services will be available as well.

## 2023-05-09 NOTE — Progress Notes (Signed)
 Sleep Medicine CONSULT Note    Date:  05/09/2023   ID:  Antonio Hanson, DOB 14-Jan-1966, MRN [id number]  PCP:  Park Meo, FNP  Cardiologist: None   Chief Complaint  Patient presents with   New Patient (Initial Visit)    OSA    History of Present Illness:  Antonio Hanson is a 58 y.o. male who is being seen today for the evaluation of obstructive sleep apnea at the request of Kurtis Bushman, FNP.  This is a 58 year old male with a history of GERD, fatty liver, hypertension and a history of severe obstructive sleep apnea with an AHI of 92/h on sleep study 2014 who has been on CPAP therapy.  I last saw him in 2020 and he was lost to follow-up.  He is now referred back to establish sleep care by his PCP.  He is doing well with his PAP device.  He tolerates the nasal mask and feels the pressure is adequate.  He does not sleep -5 hours and then wakes up and cannot go back to sleep.  He has slept like this his whole life.  He does nap 20 minutes during the day.   He denies any significant nasal dryness or nasal congestion.  He does not use a chin strap. He does not think that he snores.    Past Medical History:  Diagnosis Date   Allergy    Seasonal but take Allegra daily   Back pain    Elevated LFTs    Fatty liver    GERD (gastroesophageal reflux disease)    H/O multiple allergies    Heartburn    Hemochromatosis    HTN (hypertension)    white coat component   Joint pain    Migraines    OSA on CPAP    severe OSA with AHI 92/hr by sleep study 2014 on CPAP   Prediabetes    Sleep apnea     Past Surgical History:  Procedure Laterality Date   none      Current Medications: Current Meds  Medication Sig   acetaminophen (TYLENOL) 500 MG tablet Take 500 mg by mouth every 6 (six) hours as needed.   Cholecalciferol (VITAMIN D3) 50 MCG (2000 UT) capsule Take 2,000 Units by mouth daily.   Continuous Glucose Sensor (FREESTYLE LIBRE 3 SENSOR) MISC Place 1 sensor on the skin every  14 days. Use to check glucose continuously   Continuous Glucose Sensor (FREESTYLE LIBRE 3 SENSOR) MISC Place 1 sensor on the skin every 14 days. Use to check glucose continuously   fexofenadine (ALLEGRA ODT) 30 MG disintegrating tablet Take 30 mg by mouth daily.   lisinopril (ZESTRIL) 20 MG tablet Take 0.5 tablets (10 mg total) by mouth daily.   metoCLOPramide (REGLAN) 10 MG tablet Take 10 mg by mouth every 6 (six) hours as needed.    omeprazole (PRILOSEC) 40 MG capsule Take 1 capsule (40 mg total) by mouth daily.   ondansetron (ZOFRAN) 4 MG tablet Take 1 tablet (4 mg total) by mouth every 8 (eight) hours as needed for nausea or vomiting.   rosuvastatin (CRESTOR) 10 MG tablet TAKE 1 TABLET (10 MG TOTAL) BY MOUTH EVERY 3 (THREE) DAYS.   Semaglutide, 2 MG/DOSE, (OZEMPIC, 2 MG/DOSE,) 8 MG/3ML SOPN INJECT 2MG  DOSE UNDER THE SKIN ONCE WEEKLY    Allergies:   Mounjaro [tirzepatide]   Social History   Socioeconomic History   Marital status: Married    Spouse name: Not on  file   Number of children: Not on file   Years of education: Not on file   Highest education level: Associate degree: occupational, technical, or vocational program  Occupational History   Occupation: semi-Retired  Tobacco Use   Smoking status: Former    Current packs/day: 0.00    Average packs/day: 1 pack/day for 10.0 years (10.0 ttl pk-yrs)    Types: Cigarettes    Start date: 03/09/1983    Quit date: 03/08/1993    Years since quitting: 30.1   Smokeless tobacco: Never  Substance and Sexual Activity   Alcohol use: Yes    Alcohol/week: 12.0 standard drinks of alcohol    Types: 12 Shots of liquor per week   Drug use: Never   Sexual activity: Yes    Birth control/protection: None  Other Topics Concern   Not on file  Social History Narrative   Not on file   Social Drivers of Health   Financial Resource Strain: Low Risk  (07/05/2022)   Overall Financial Resource Strain (CARDIA)    Difficulty of Paying Living Expenses:  Not very hard  Food Insecurity: No Food Insecurity (07/05/2022)   Hunger Vital Sign    Worried About Running Out of Food in the Last Year: Never true    Ran Out of Food in the Last Year: Never true  Transportation Needs: No Transportation Needs (07/05/2022)   PRAPARE - Administrator, Civil Service (Medical): No    Lack of Transportation (Non-Medical): No  Physical Activity: Sufficiently Active (07/05/2022)   Exercise Vital Sign    Days of Exercise per Week: 6 days    Minutes of Exercise per Session: 50 min  Stress: No Stress Concern Present (07/05/2022)   [employer] of Occupational Health - Occupational Stress Questionnaire    Feeling of Stress : Only a little  Social Connections: Moderately Integrated (07/05/2022)   Social Connection and Isolation Panel [NHANES]    Frequency of Communication with Friends and Family: Three times a week    Frequency of Social Gatherings with Friends and Family: More than three times a week    Attends Religious Services: 1 to 4 times per year    Active Member of [employer] or Organizations: No    Attends Engineer, structural: Not on file    Marital Status: Married     Family History:  The patient's family history includes Cancer in his mother; Diabetes in his father; Early death in his mother; Hypertension in his father; Lymphoma in his mother; Obesity in his father.   ROS:   Please see the history of present illness.    ROS All other systems reviewed and are negative.      No data to display             PHYSICAL EXAM:   VS:  BP 122/82   Pulse 93   Ht 5\' 7"  (1.702 m)   Wt 236 lb 3.2 oz (107.1 kg)   SpO2 97%   BMI 36.99 kg/m    GEN: Well nourished, well developed, in no acute distress  HEENT: normal  Neck: no JVD, carotid bruits, or masses Cardiac: RRR; no murmurs, rubs, or gallops,no edema.  Intact distal pulses bilaterally.  Respiratory:  clear to auscultation bilaterally, normal work of breathing GI: soft,  nontender, nondistended, + BS MS: no deformity or atrophy  Skin: warm and dry, no rash Neuro:  Alert and Oriented x 3, Strength and sensation are intact Psych: euthymic mood, full  affect  Wt Readings from Last 3 Encounters:  05/09/23 236 lb 3.2 oz (107.1 kg)  04/07/23 239 lb (108.4 kg)  09/30/22 247 lb (112 kg)      Studies/Labs Reviewed:   PAP compliance download  EKG Interpretation Date/Time:  Monday May 09 2023 14:17:21 EST Ventricular Rate:  103 PR Interval:  142 QRS Duration:  90 QT Interval:  338 QTC Calculation: 442 R Axis:   106  Text Interpretation: Sinus tachycardia Rightward axis No previous ECGs available Confirmed by Armanda Magic [phone]) on 05/09/2023 2:23:36 PM    Recent Labs: 03/31/2023: ALT 41; BUN 16; Creat 0.81; Hemoglobin 17.9; Platelets 241; Potassium 4.2; Sodium 138    Additional studies/ records that were reviewed today include:  none    ASSESSMENT:    1. OSA on CPAP   2. Benign essential HTN      PLAN:  In order of problems listed above:  OSA - The patient is tolerating PAP therapy well without any problems. The patient has been using and benefiting from PAP use and will continue to benefit from therapy.  -He needs a new DME that we will assign to him.  -his device is over 69 years old so I will order him a new ResMed CPAP at 11cm H2O with heated humidity and mask of choice  Hypertension -BP controlled on exam today -Continue prescription drug management with lisinopril 10 mg daily with as needed refills   Time Spent: 20 minutes total time of encounter, including 15 minutes spent in face-to-face patient care on the date of this encounter. This time includes coordination of care and counseling regarding above mentioned problem list. Remainder of non-face-to-face time involved reviewing chart documents/testing relevant to the patient encounter and documentation in the medical record. I have independently reviewed documentation from  referring provider  Medication Adjustments/Labs and Tests Ordered: Current medicines are reviewed at length with the patient today.  Concerns regarding medicines are outlined above.  Medication changes, Labs and Tests ordered today are listed in the Patient Instructions below.  There are no Patient Instructions on file for this visit.   Signed, Armanda Magic, MD  05/09/2023 2:08 PM    [facility] [address] [place], [place], [place]  [address] Phone: [phone]; Fax: [phone]

## 2023-05-11 ENCOUNTER — Telehealth: Payer: Self-pay | Admitting: *Deleted

## 2023-05-11 DIAGNOSIS — G4733 Obstructive sleep apnea (adult) (pediatric): Secondary | ICD-10-CM

## 2023-05-11 DIAGNOSIS — R0683 Snoring: Secondary | ICD-10-CM

## 2023-05-11 NOTE — Telephone Encounter (Signed)
-----   Message from Armanda Magic sent at 05/09/2023  2:11 PM EST ----- order him a new ResMed CPAP at 11cm H2O with heated humidity and mask of choice

## 2023-05-11 NOTE — Telephone Encounter (Signed)
 Order placed to [facility] to order a ResMed CPAP at 11cm H2O with heated humidity and mask of choice

## 2023-05-20 ENCOUNTER — Other Ambulatory Visit: Payer: Self-pay

## 2023-05-20 DIAGNOSIS — E1165 Type 2 diabetes mellitus with hyperglycemia: Secondary | ICD-10-CM

## 2023-05-20 MED ORDER — OZEMPIC (2 MG/DOSE) 8 MG/3ML ~~LOC~~ SOPN
2.0000 mg | PEN_INJECTOR | SUBCUTANEOUS | 3 refills | Status: DC
  Filled 2024-01-02: qty 9, 84d supply, fill #0

## 2023-06-03 ENCOUNTER — Other Ambulatory Visit (HOSPITAL_COMMUNITY): Payer: Self-pay

## 2023-06-06 ENCOUNTER — Other Ambulatory Visit (HOSPITAL_COMMUNITY): Payer: Self-pay

## 2023-06-26 ENCOUNTER — Other Ambulatory Visit: Payer: Self-pay | Admitting: Family Medicine

## 2023-06-27 ENCOUNTER — Encounter: Payer: Self-pay | Admitting: Family Medicine

## 2023-06-27 ENCOUNTER — Other Ambulatory Visit: Payer: Self-pay

## 2023-06-27 DIAGNOSIS — E1165 Type 2 diabetes mellitus with hyperglycemia: Secondary | ICD-10-CM

## 2023-06-27 MED ORDER — FREESTYLE LIBRE 3 PLUS SENSOR MISC: 6 refills | Status: DC

## 2023-06-27 NOTE — Telephone Encounter (Signed)
 Requested medication (s) are due for refill today: yes  Requested medication (s) are on the active medication list: yes  Last refill:  03/22/23  Future visit scheduled: no  Notes to clinic:  Patient comment: Can we transition to the [facility] 3 Plus? I understand the Jerrilyn Moras 3 is being discontinued. Thank you!      Requested Prescriptions  Pending Prescriptions Disp Refills   Continuous Glucose Sensor (FREESTYLE LIBRE 3 SENSOR) MISC 2 each 3    Sig: Place 1 sensor on the skin every 14 days. Use to check glucose continuously     Endocrinology: Diabetes - Testing Supplies Passed - 06/27/2023  2:45 PM      Passed - Valid encounter within last 12 months    Recent Outpatient Visits           2 months ago Physical exam, annual   [place] [facility] Jenelle Mis, FNP   9 months ago Essential hypertension   [place] [facility] Jenelle Mis, FNP   11 months ago Type 2 diabetes mellitus with hyperglycemia, without long-term current use of insulin  [facility])   [place] [facility] Jenelle Mis, FNP   1 year ago Type 2 diabetes mellitus with hyperglycemia, without long-term current use of insulin  [facility])   [place] [facility] Jenelle Mis, FNP

## 2023-06-28 ENCOUNTER — Other Ambulatory Visit (HOSPITAL_COMMUNITY): Payer: Self-pay

## 2023-06-28 ENCOUNTER — Other Ambulatory Visit: Payer: Self-pay

## 2023-06-28 DIAGNOSIS — E1165 Type 2 diabetes mellitus with hyperglycemia: Secondary | ICD-10-CM

## 2023-06-28 MED ORDER — FREESTYLE LIBRE 3 PLUS SENSOR MISC
6 refills | Status: DC
  Filled 2023-06-28 (×3): qty 2, 30d supply, fill #0
  Filled 2023-07-24: qty 2, 30d supply, fill #1
  Filled 2023-08-23: qty 2, 30d supply, fill #2
  Filled 2023-09-22: qty 2, 30d supply, fill #3
  Filled 2023-10-21: qty 2, 30d supply, fill #4
  Filled 2023-11-20: qty 2, 30d supply, fill #5
  Filled 2023-12-23: qty 2, 30d supply, fill #6

## 2023-07-18 ENCOUNTER — Other Ambulatory Visit (HOSPITAL_COMMUNITY): Payer: Self-pay

## 2023-07-24 ENCOUNTER — Other Ambulatory Visit (HOSPITAL_COMMUNITY): Payer: Self-pay

## 2023-08-01 ENCOUNTER — Other Ambulatory Visit: Payer: Self-pay | Admitting: Family Medicine

## 2023-08-01 DIAGNOSIS — K760 Fatty (change of) liver, not elsewhere classified: Secondary | ICD-10-CM

## 2023-08-01 DIAGNOSIS — Z6841 Body Mass Index (BMI) 40.0 and over, adult: Secondary | ICD-10-CM

## 2023-08-01 DIAGNOSIS — E1165 Type 2 diabetes mellitus with hyperglycemia: Secondary | ICD-10-CM

## 2023-08-03 ENCOUNTER — Ambulatory Visit: Attending: Cardiology | Admitting: Cardiology

## 2023-08-03 VITALS — BP 120/82 | HR 106 | Ht 68.0 in | Wt 240.0 lb

## 2023-08-03 DIAGNOSIS — G4733 Obstructive sleep apnea (adult) (pediatric): Secondary | ICD-10-CM

## 2023-08-03 DIAGNOSIS — I10 Essential (primary) hypertension: Secondary | ICD-10-CM | POA: Diagnosis not present

## 2023-08-03 NOTE — Progress Notes (Signed)
 Sleep Medicine Note    Date:  08/03/2023   ID:  Antonio Hanson, DOB [birth year], MRN [id number]  PCP:  Jenelle Mis, FNP  Cardiologist: None   Chief Complaint  Patient presents with   Sleep Apnea    History of Present Illness:  Antonio Hanson is a 58 y.o. male with a history of GERD, fatty liver, hypertension and a history of severe obstructive sleep apnea with an AHI of 92/h on sleep study 2014 who has been on CPAP therapy.  I last saw him in 2020 and he was lost to follow-up.  He saw me back 2 months ago to reestablish sleep care and we ordered him a new CPAP device.  He is now back for follow-up after getting his new device per insurance requirements to document compliance.  He is doing well with his PAP device and thinks that he has gotten used to it.  He got a new mask which he does not like because it blows the air out the front of the mask but otherwise really likes the mask.  He feels the pressure is adequate.  Since going on PAP he feels rested in the am and has no significant daytime sleepiness although most days he takes a nap for 20 minutes in the afternoon.  He denies any significant mouth or nasal dryness or nasal congestion.  He does not think that he snores.      Past Medical History:  Diagnosis Date   Allergy    Seasonal but take Allegra daily   Back pain    Elevated LFTs    Fatty liver    GERD (gastroesophageal reflux disease)    H/O multiple allergies    Heartburn    Hemochromatosis    HTN (hypertension)    white coat component   Joint pain    Migraines    OSA on CPAP    severe OSA with AHI 92/hr by sleep study 2014 on CPAP   Prediabetes    Sleep apnea     Past Surgical History:  Procedure Laterality Date   none      Current Medications: Current Meds  Medication Sig   acetaminophen (TYLENOL) 500 MG tablet Take 500 mg by mouth every 6 (six) hours as needed.   Cholecalciferol (VITAMIN D3) 50 MCG (2000 UT) capsule Take 2,000 Units by mouth daily.    Continuous Glucose Sensor (FREESTYLE LIBRE 3 PLUS SENSOR) MISC Change sensor every 15 days.   Continuous Glucose Sensor (FREESTYLE LIBRE 3 SENSOR) MISC Place 1 sensor on the skin every 14 days. Use to check glucose continuously   fexofenadine (ALLEGRA ODT) 30 MG disintegrating tablet Take 30 mg by mouth daily.   lisinopril  (ZESTRIL ) 20 MG tablet Take 0.5 tablets (10 mg total) by mouth daily.   metoCLOPramide (REGLAN) 10 MG tablet Take 10 mg by mouth every 6 (six) hours as needed.    omeprazole  (PRILOSEC) 40 MG capsule Take 1 capsule (40 mg total) by mouth daily.   ondansetron  (ZOFRAN ) 4 MG tablet Take 1 tablet (4 mg total) by mouth every 8 (eight) hours as needed for nausea or vomiting.   rosuvastatin  (CRESTOR ) 10 MG tablet TAKE 1 TABLET (10 MG TOTAL) BY MOUTH EVERY 3 (THREE) DAYS.   Semaglutide , 2 MG/DOSE, (OZEMPIC , 2 MG/DOSE,) 8 MG/3ML SOPN Inject 2 mg into the skin once a week.    Allergies:   Mounjaro  [tirzepatide ]   Social History   Socioeconomic History   Marital  status: Married    Spouse name: Not on file   Number of children: Not on file   Years of education: Not on file   Highest education level: Associate degree: occupational, Scientist, product/process development, or vocational program  Occupational History   Occupation: semi-Retired  Tobacco Use   Smoking status: Former    Current packs/day: 0.00    Average packs/day: 1 pack/day for 10.0 years (10.0 ttl pk-yrs)    Types: Cigarettes    Start date: 03/09/1983    Quit date: 03/08/1993    Years since quitting: 30.4   Smokeless tobacco: Never  Substance and Sexual Activity   Alcohol use: Yes    Alcohol/week: 12.0 standard drinks of alcohol    Types: 12 Shots of liquor per week   Drug use: Never   Sexual activity: Yes    Birth control/protection: None  Other Topics Concern   Not on file  Social History Narrative   Not on file   Social Drivers of Health   Financial Resource Strain: Low Risk  (07/05/2022)   Overall Financial Resource Strain  (CARDIA)    Difficulty of Paying Living Expenses: Not very hard  Food Insecurity: No Food Insecurity (07/05/2022)   Hunger Vital Sign    Worried About Running Out of Food in the Last Year: Never true    Ran Out of Food in the Last Year: Never true  Transportation Needs: No Transportation Needs (07/05/2022)   PRAPARE - Administrator, Civil Service (Medical): No    Lack of Transportation (Non-Medical): No  Physical Activity: Sufficiently Active (07/05/2022)   Exercise Vital Sign    Days of Exercise per Week: 6 days    Minutes of Exercise per Session: 50 min  Stress: No Stress Concern Present (07/05/2022)   [employer] of Occupational Health - Occupational Stress Questionnaire    Feeling of Stress : Only a little  Social Connections: Moderately Integrated (07/05/2022)   Social Connection and Isolation Panel [NHANES]    Frequency of Communication with Friends and Family: Three times a week    Frequency of Social Gatherings with Friends and Family: More than three times a week    Attends Religious Services: 1 to 4 times per year    Active Member of [employer] or Organizations: No    Attends Engineer, structural: Not on file    Marital Status: Married     Family History:  The patient's family history includes Cancer in his mother; Diabetes in his father; Early death in his mother; Hypertension in his father; Lymphoma in his mother; Obesity in his father.   ROS:   Please see the history of present illness.    ROS All other systems reviewed and are negative.      No data to display             PHYSICAL EXAM:   VS:  BP 120/82 (BP Location: Left Arm)   Pulse (!) 106   Ht 5\' 8"  (1.727 m)   Wt 240 lb (108.9 kg)   SpO2 97%   BMI 36.49 kg/m    GEN: Well nourished, well developed in no acute distress HEENT: Normal NECK: No JVD; No carotid bruits LYMPHATICS: No lymphadenopathy CARDIAC:RRR, no murmurs, rubs, gallops RESPIRATORY:  Clear to auscultation without  rales, wheezing or rhonchi  ABDOMEN: Soft, non-tender, non-distended MUSCULOSKELETAL:  No edema; No deformity  SKIN: Warm and dry NEUROLOGIC:  Alert and oriented x 3 PSYCHIATRIC:  Normal affect  Wt Readings from  Last 3 Encounters:  08/03/23 240 lb (108.9 kg)  05/09/23 236 lb 3.2 oz (107.1 kg)  04/07/23 239 lb (108.4 kg)      Studies/Labs Reviewed:   PAP compliance download       Recent Labs: 03/31/2023: ALT 41; BUN 16; Creat 0.81; Hemoglobin 17.9; Platelets 241; Potassium 4.2; Sodium 138    Additional studies/ records that were reviewed today include:  none    ASSESSMENT:    1. OSA on CPAP   2. Benign essential HTN       PLAN:  In order of problems listed above:  OSA - The patient is tolerating PAP therapy well without any problems. The PAP download performed by his DME was personally reviewed and interpreted by me today and showed an AHI of 0.1 /hr on 11 cm H2O with 100% compliance in using more than 4 hours nightly.  The patient has been using and benefiting from PAP use and will continue to benefit from therapy.   Hypertension - BP controlled on exam today - Continue lisinopril  10 mg daily with as needed refills  Followup with me in 1 year   Time Spent: 20 minutes total time of encounter, including 15 minutes spent in face-to-face patient care on the date of this encounter. This time includes coordination of care and counseling regarding above mentioned problem list. Remainder of non-face-to-face time involved reviewing chart documents/testing relevant to the patient encounter and documentation in the medical record. I have independently reviewed documentation from referring provider  Medication Adjustments/Labs and Tests Ordered: Current medicines are reviewed at length with the patient today.  Concerns regarding medicines are outlined above.  Medication changes, Labs and Tests ordered today are listed in the Patient Instructions below.  There are no Patient  Instructions on file for this visit.   Signed, Gaylyn Keas, MD  08/03/2023 2:00 PM    [facility] [address] [place], [place], [place]  [address] Phone: [phone]; Fax: [phone]

## 2023-08-23 ENCOUNTER — Other Ambulatory Visit: Payer: Self-pay

## 2023-09-14 ENCOUNTER — Other Ambulatory Visit: Payer: Self-pay | Admitting: Family Medicine

## 2023-09-14 DIAGNOSIS — I10 Essential (primary) hypertension: Secondary | ICD-10-CM

## 2023-10-21 ENCOUNTER — Other Ambulatory Visit (HOSPITAL_COMMUNITY): Payer: Self-pay

## 2023-10-27 ENCOUNTER — Other Ambulatory Visit: Payer: Self-pay | Admitting: Family Medicine

## 2024-01-02 ENCOUNTER — Other Ambulatory Visit (HOSPITAL_COMMUNITY): Payer: Self-pay

## 2024-01-04 LAB — OPHTHALMOLOGY REPORT-SCANNED

## 2024-01-26 ENCOUNTER — Other Ambulatory Visit: Payer: Self-pay | Admitting: Family Medicine

## 2024-01-26 DIAGNOSIS — Z6841 Body Mass Index (BMI) 40.0 and over, adult: Secondary | ICD-10-CM

## 2024-01-26 DIAGNOSIS — E1165 Type 2 diabetes mellitus with hyperglycemia: Secondary | ICD-10-CM

## 2024-01-26 DIAGNOSIS — K760 Fatty (change of) liver, not elsewhere classified: Secondary | ICD-10-CM

## 2024-01-30 ENCOUNTER — Other Ambulatory Visit: Payer: Self-pay | Admitting: Family Medicine

## 2024-01-30 DIAGNOSIS — E1165 Type 2 diabetes mellitus with hyperglycemia: Secondary | ICD-10-CM

## 2024-01-30 MED ORDER — FREESTYLE LIBRE 3 PLUS SENSOR MISC
6 refills | Status: DC
  Filled 2024-01-30: qty 2, 30d supply, fill #0

## 2024-01-31 ENCOUNTER — Other Ambulatory Visit (HOSPITAL_COMMUNITY): Payer: Self-pay

## 2024-01-31 ENCOUNTER — Encounter: Payer: Self-pay | Admitting: Family Medicine

## 2024-01-31 ENCOUNTER — Ambulatory Visit: Admitting: Family Medicine

## 2024-01-31 VITALS — BP 130/85 | HR 88 | Temp 97.9°F | Ht 68.0 in | Wt 244.6 lb

## 2024-01-31 DIAGNOSIS — E1165 Type 2 diabetes mellitus with hyperglycemia: Secondary | ICD-10-CM

## 2024-01-31 DIAGNOSIS — Z125 Encounter for screening for malignant neoplasm of prostate: Secondary | ICD-10-CM

## 2024-01-31 DIAGNOSIS — G4733 Obstructive sleep apnea (adult) (pediatric): Secondary | ICD-10-CM

## 2024-01-31 DIAGNOSIS — I10 Essential (primary) hypertension: Secondary | ICD-10-CM

## 2024-01-31 DIAGNOSIS — Z7985 Long-term (current) use of injectable non-insulin antidiabetic drugs: Secondary | ICD-10-CM

## 2024-01-31 MED ORDER — FREESTYLE LIBRE 3 PLUS SENSOR MISC
6 refills | Status: AC
  Filled 2024-01-31: qty 2, 30d supply, fill #0
  Filled 2024-02-25: qty 2, 30d supply, fill #1
  Filled 2024-03-12 – 2024-03-26 (×2): qty 2, 30d supply, fill #2

## 2024-01-31 NOTE — Progress Notes (Signed)
 Acute Office Visit  Patient ID: Antonio Hanson, male    DOB: Nov 19, 1965, 58 y.o.   MRN: [id number]  PCP: Kayla Antonio RAMAN, FNP  Chief Complaint  Patient presents with   Medical Management of Chronic Issues    Rx follow up.      Subjective:     HPI  Antonio Hanson is here today for chronic condition management. PMH includes DM, HTN, HLD, hemochromatosis, obesity, OSA, GERD  DM: using Libre CGM, on Ozempic  2mg  weekly, GMI 5.8% Denies hypoglycemia, polyuria, polydypsia, paresthesias, or chest pain. Lab Results  Component Value Date   HGBA1C 5.5 03/31/2023   HGBA1C 5.9 (H) 09/30/2022   HGBA1C 7.2 03/24/2022   HLD: on Rosuvastatin  10mg  twice weekly  HTN: on Lisinopril  20mg  daily, home readings 125/84 Denies chest pain, palpitations, recurrent headaches, vision changes, lightheadedness, dizziness, dyspnea on exertion, or swelling of extremities.  GERD: Omeprazole  40mg  daily hematemesis, melena, hematochezia, occult blood in stool, iron deficiency anemia, anorexia, unexplained weight loss, dysphagia, odynophagia, persistent vomiting  OSA: on CPAP      Discussed the use of AI scribe software for clinical note transcription with the patient, who gave verbal consent to proceed.  History of Present Illness Antonio Hanson is a 58 year old male with diabetes and hypertension who presents for medication management and follow-up.  He has not decreased his lisinopril  dosage and is currently taking 20 mg daily. His home blood pressure readings are typically around 128/84 mmHg, with occasional readings of 125/84 mmHg. Today, his blood pressure was 130/85 mmHg, which he attributes to dietary choices. He sometimes checks his blood pressure at home.  He continues to use Ozempic  for diabetes management, administering it every six days. He monitors his blood glucose using a Freestyle Libre 3 plus sensor. His fasting blood sugars occasionally drop to 54 mg/dL, particularly around 6:69 PM, but he  manages this by eating when necessary. His highest recorded blood sugar was 212 mg/dL after consuming a soft drink. His typical blood sugar levels fluctuate between 80 and 170 mg/dL. His glucose management indicator is currently 6% over seven days, 5.9% over thirty days, and 5.8% over ninety days. He is not taking any oral diabetes medications and his last A1c was 5.5% in January.  He takes rosuvastatin  twice a week for cholesterol management, which has been effective. He also takes Prilosec 40 mg daily for GERD, which controls his symptoms well. He uses Reglan as needed, about once or twice a year, for reflux issues if he eats dinner late, and Zofran  about once a month as needed.  He uses a CPAP machine for sleep apnea and does not smoke. No chest pain, palpitations, recurrent headaches, vision changes, lightheadedness, dizziness, shortness of breath, leg swelling, or dry cough. He denies any numbness or tingling in his legs, extreme thirst, or frequent urination beyond his lifelong norm. He takes Allegra daily for allergies.  He has not had recent labs done. He is up to date with his colonoscopy, being on a five-year plan, and his last eye check was recent.   Review of Systems  All other systems reviewed and are negative.   Past Medical History:  Diagnosis Date   Allergy    Seasonal but take Allegra daily   Back pain    Elevated LFTs    Fatty liver    GERD (gastroesophageal reflux disease)    H/O multiple allergies    Heartburn    Hemochromatosis    HTN (hypertension)  white coat component   Joint pain    Migraines    OSA on CPAP    severe OSA with AHI 92/hr by sleep study 2014 on CPAP   Prediabetes    Sleep apnea     Past Surgical History:  Procedure Laterality Date   none      Current Outpatient Medications on File Prior to Visit  Medication Sig Dispense Refill   acetaminophen (TYLENOL) 500 MG tablet Take 500 mg by mouth every 6 (six) hours as needed.     fexofenadine  (ALLEGRA ODT) 30 MG disintegrating tablet Take 30 mg by mouth daily.     lisinopril  (ZESTRIL ) 20 MG tablet TAKE 1 TABLET BY MOUTH EVERY DAY 90 tablet 1   metoCLOPramide (REGLAN) 10 MG tablet Take 10 mg by mouth every 6 (six) hours as needed.      omeprazole  (PRILOSEC) 40 MG capsule Take 1 capsule (40 mg total) by mouth daily. 90 capsule 1   ondansetron  (ZOFRAN ) 4 MG tablet Take 1 tablet (4 mg total) by mouth every 8 (eight) hours as needed for nausea or vomiting. 20 tablet 1   rosuvastatin  (CRESTOR ) 10 MG tablet TAKE 1 TABLET (10 MG TOTAL) BY MOUTH EVERY 3 (THREE) DAYS. 30 tablet 1   Semaglutide , 2 MG/DOSE, (OZEMPIC , 2 MG/DOSE,) 8 MG/3ML SOPN Inject 2 mg into the skin once a week. 9 mL 3   No current facility-administered medications on file prior to visit.    Allergies  Allergen Reactions   Mounjaro  [Tirzepatide ] Other (See Comments)    Injection site reaction       Objective:    BP 130/85   Pulse 88   Temp 97.9 F (36.6 C)   Ht 5' 8 (1.727 m)   Wt 244 lb 9.6 oz (110.9 kg)   SpO2 97%   BMI 37.19 kg/m  BP Readings from Last 3 Encounters:  01/31/24 130/85  08/03/23 120/82  05/09/23 122/82   Wt Readings from Last 3 Encounters:  01/31/24 244 lb 9.6 oz (110.9 kg)  08/03/23 240 lb (108.9 kg)  05/09/23 236 lb 3.2 oz (107.1 kg)      Physical Exam Vitals and nursing note reviewed.  Constitutional:      Appearance: Normal appearance. He is obese.  HENT:     Head: Normocephalic and atraumatic.  Cardiovascular:     Rate and Rhythm: Normal rate and regular rhythm.     Pulses: Normal pulses.     Heart sounds: Normal heart sounds.  Pulmonary:     Effort: Pulmonary effort is normal.     Breath sounds: Normal breath sounds.  Skin:    General: Skin is warm and dry.     Capillary Refill: Capillary refill takes less than 2 seconds.  Neurological:     General: No focal deficit present.     Mental Status: He is alert and oriented to person, place, and time. Mental status is at  baseline.  Psychiatric:        Mood and Affect: Mood normal.        Behavior: Behavior normal.        Thought Content: Thought content normal.        Judgment: Judgment normal.       No results found for any visits on 01/31/24.     Assessment & Plan:   Problem List Items Addressed This Visit     OSA on CPAP   Relevant Orders   CBC with Differential/Platelet   Comprehensive  metabolic panel with GFR   Lipid panel   VITAMIN D  25 Hydroxy (Vit-D Deficiency, Fractures)   Hemoglobin A1c   PSA   Microalbumin / creatinine urine ratio   Type 2 diabetes mellitus with hyperglycemia, without long-term current use of insulin  ([name]) - Primary   Relevant Medications   Continuous Glucose Sensor (FREESTYLE LIBRE 3 PLUS SENSOR) MISC   Other Relevant Orders   CBC with Differential/Platelet   Comprehensive metabolic panel with GFR   Lipid panel   VITAMIN D  25 Hydroxy (Vit-D Deficiency, Fractures)   Hemoglobin A1c   PSA   Microalbumin / creatinine urine ratio   Essential hypertension   Relevant Orders   CBC with Differential/Platelet   Comprehensive metabolic panel with GFR   Lipid panel   VITAMIN D  25 Hydroxy (Vit-D Deficiency, Fractures)   Hemoglobin A1c   PSA   Microalbumin / creatinine urine ratio   Morbid obesity ([name])   Relevant Orders   CBC with Differential/Platelet   Comprehensive metabolic panel with GFR   Lipid panel   VITAMIN D  25 Hydroxy (Vit-D Deficiency, Fractures)   Hemoglobin A1c   PSA   Microalbumin / creatinine urine ratio   Obstructive sleep apnea   Relevant Orders   CBC with Differential/Platelet   Comprehensive metabolic panel with GFR   Lipid panel   VITAMIN D  25 Hydroxy (Vit-D Deficiency, Fractures)   Hemoglobin A1c   PSA   Microalbumin / creatinine urine ratio   Other Visit Diagnoses       Prostate cancer screening       Relevant Orders   CBC with Differential/Platelet   Comprehensive metabolic panel with GFR   Lipid panel   VITAMIN D  25  Hydroxy (Vit-D Deficiency, Fractures)   Hemoglobin A1c   PSA   Microalbumin / creatinine urine ratio       Assessment and Plan Assessment & Plan Adult Wellness Visit Routine wellness visit with discussion on general health maintenance, including vaccinations and screenings. - Scheduled follow-up appointment in January for physical exam and blood work, declined labs today as he is not fasting - Will administer pneumonia vaccine in January.  Type 2 diabetes mellitus with hyperglycemia Type 2 diabetes managed with Ozempic  2 mg weekly. Blood glucose levels monitored with Freestyle Libre 3 Plus. Occasional hypoglycemia, no significant hyperglycemia. A1c was 5.5% in January. No symptoms of diabetic complications. - Continue Ozempic  2 mg weekly. - Refilled Freestyle Libre 3 Plus sensor. - Monitor blood glucose levels regularly.  Essential hypertension Hypertension managed with lisinopril  20 mg daily. Home blood pressure readings typically around 128/84 mmHg. Goal blood pressure for diabetics is less than 130/80 mmHg. No symptoms of hypertensive complications. - Monitor blood pressure regularly. - Will consider increasing lisinopril  to 40 mg if diastolic pressure remains in the 80s.  Class 3 severe obesity with serious comorbidity Class 3 severe obesity with BMI between 40.0 to 44.9.  Obstructive sleep apnea Managed with CPAP. No new symptoms reported. - Continue CPAP therapy.  Gastroesophageal reflux disease GERD managed with omeprazole  40 mg daily. Symptoms well controlled. Occasional use of Reglan and Zofran  for breakthrough symptoms. - Continue omeprazole  40 mg daily. - Use Reglan and Zofran  as needed for breakthrough symptoms.  Hyperlipidemia Managed with rosuvastatin  10 mg every three days. Previous labs showed good control. - Continue rosuvastatin  10 mg every three days.  Allergic rhinitis Managed with fexofenadine 30 mg daily. No new symptoms reported. - Continue  fexofenadine 30 mg daily.    Meds ordered this  encounter  Medications   Continuous Glucose Sensor (FREESTYLE LIBRE 3 PLUS SENSOR) MISC    Sig: Change sensor every 15 days.    Dispense:  2 each    Refill:  6    Supervising Provider:   DUANNE LOWERS T [[id number]]    Return in about 6 weeks (around 03/13/2024) for annual physical with labs 1 week prior.  Antonio GORMAN Barrio, FNP [facility]

## 2024-02-25 ENCOUNTER — Other Ambulatory Visit: Payer: Self-pay | Admitting: Family Medicine

## 2024-02-25 DIAGNOSIS — I10 Essential (primary) hypertension: Secondary | ICD-10-CM

## 2024-03-12 ENCOUNTER — Other Ambulatory Visit: Payer: Self-pay

## 2024-03-12 DIAGNOSIS — E1165 Type 2 diabetes mellitus with hyperglycemia: Secondary | ICD-10-CM

## 2024-03-13 ENCOUNTER — Other Ambulatory Visit (HOSPITAL_COMMUNITY): Payer: Self-pay

## 2024-03-13 MED ORDER — OZEMPIC (2 MG/DOSE) 8 MG/3ML ~~LOC~~ SOPN
2.0000 mg | PEN_INJECTOR | SUBCUTANEOUS | 3 refills | Status: AC
  Filled 2024-03-13: qty 9, 84d supply, fill #0

## 2024-03-14 ENCOUNTER — Other Ambulatory Visit (HOSPITAL_COMMUNITY): Payer: Self-pay

## 2024-04-06 ENCOUNTER — Other Ambulatory Visit

## 2024-04-06 DIAGNOSIS — Z125 Encounter for screening for malignant neoplasm of prostate: Secondary | ICD-10-CM

## 2024-04-06 DIAGNOSIS — Z0000 Encounter for general adult medical examination without abnormal findings: Secondary | ICD-10-CM

## 2024-04-09 ENCOUNTER — Ambulatory Visit: Payer: Self-pay | Admitting: Family Medicine

## 2024-04-12 ENCOUNTER — Encounter: Payer: Self-pay | Admitting: Family Medicine

## 2024-04-12 ENCOUNTER — Other Ambulatory Visit (HOSPITAL_COMMUNITY): Payer: Self-pay

## 2024-04-12 ENCOUNTER — Ambulatory Visit: Admitting: Family Medicine

## 2024-04-12 VITALS — BP 126/78 | HR 97 | Ht 68.0 in | Wt 242.8 lb

## 2024-04-12 DIAGNOSIS — B351 Tinea unguium: Secondary | ICD-10-CM | POA: Insufficient documentation

## 2024-04-12 DIAGNOSIS — I10 Essential (primary) hypertension: Secondary | ICD-10-CM

## 2024-04-12 DIAGNOSIS — E1169 Type 2 diabetes mellitus with other specified complication: Secondary | ICD-10-CM | POA: Insufficient documentation

## 2024-04-12 DIAGNOSIS — G4733 Obstructive sleep apnea (adult) (pediatric): Secondary | ICD-10-CM

## 2024-04-12 DIAGNOSIS — Z7985 Long-term (current) use of injectable non-insulin antidiabetic drugs: Secondary | ICD-10-CM | POA: Insufficient documentation

## 2024-04-12 DIAGNOSIS — K219 Gastro-esophageal reflux disease without esophagitis: Secondary | ICD-10-CM

## 2024-04-12 DIAGNOSIS — Z0000 Encounter for general adult medical examination without abnormal findings: Secondary | ICD-10-CM

## 2024-04-12 MED ORDER — ONDANSETRON HCL 4 MG PO TABS
4.0000 mg | ORAL_TABLET | Freq: Three times a day (TID) | ORAL | 0 refills | Status: AC | PRN
  Filled 2024-04-12: qty 10, 4d supply, fill #0

## 2024-04-12 MED ORDER — CICLOPIROX 8 % EX SOLN
Freq: Every day | CUTANEOUS | 0 refills | Status: AC
  Filled 2024-04-12: qty 6.6, 30d supply, fill #0

## 2024-04-12 NOTE — Progress Notes (Signed)
 Pt. Has concerns of increasing dosage of lisinopril .   Would like to know if he needs a vitamin D  supplement  Wants to know if he could get his zofran  refilled

## 2024-04-12 NOTE — Progress Notes (Signed)
 "  Complete physical exam  Patient: Antonio Hanson    DOB: 10-09-1965 59 y.o.   MRN: [id number]  Chief Complaint  Patient presents with   Annual Exam    CPE    Subjective:    Antonio Hanson is a 59 y.o. male who presents today for a complete physical exam. He reports consuming a general diet. The patient does not participate in regular exercise at present. He generally feels well. He reports sleeping well. He does not have additional problems to discuss today.   PMH includes DM, HTN, HLD, hemochromatosis, obesity, OSA, GERD Followed by Cardiology. Normal PSA, colonoscopy due 2027   DM: using Libre CGM, on Ozempic  2mg  weekly Denies hypoglycemia, polyuria, polydypsia, paresthesias, or chest pain. Lab Results  Component Value Date   HGBA1C 5.5 04/06/2024   HGBA1C 5.5 03/31/2023   HGBA1C 5.9 (H) 09/30/2022   HLD: on Rosuvastatin  10mg  twice weekly Lipid Panel     Component Value Date/Time   CHOL 132 04/06/2024 0815   CHOL 194 10/17/2019 1507   TRIG 63 04/06/2024 0815   HDL 59 04/06/2024 0815   HDL 42 10/17/2019 1507   CHOLHDL 2.2 04/06/2024 0815   LDLCALC 59 04/06/2024 0815   LABVLDL 24 10/17/2019 1507    HTN: on Lisinopril  20mg  daily Denies chest pain, palpitations, recurrent headaches, vision changes, lightheadedness, dizziness, dyspnea on exertion, or swelling of extremities.   GERD: Omeprazole  40mg  daily hematemesis, melena, hematochezia, occult blood in stool, iron deficiency anemia, anorexia, unexplained weight loss, dysphagia, odynophagia, persistent vomiting   OSA: on CPAP  Hemochromatosis: not taking iron supplements, eats red meat three times weekly, declines phlebotomy Hemoglobin & Hematocrit     Component Value Date/Time   HGB 18.3 (H) 04/06/2024 0815   HGB 18.2 (H) 10/17/2019 1507   HCT 55.8 (H) 04/06/2024 0815   HCT 53.0 (H) 10/17/2019 1507      Discussed the use of AI scribe software for clinical note transcription with the patient, who gave  verbal consent to proceed.  History of Present Illness Antonio Hanson is a 59 year old male with hemochromatosis who presents for a follow-up visit to discuss lab results and management of his condition.  He has a history of hemochromatosis with consistently high hemoglobin levels, recently at 18.3. Previous levels were 17.9, 18.1, and 18.2 since 2021. He has not been seeing a hematologist or undergoing phlebotomy, and his previous doctor did not recommend blood donation. He does not consume a high iron diet or take multivitamins with iron.  He is on lisinopril  daily, rosuvastatin  twice a week, Ozempic  every six days, and omeprazole  40 mg daily. He reports that his acid reflux does not cause him problems, and he experiences fewer side effects from Ozempic  when injected in his arms rather than his abdomen.  He has a history of elevated liver enzymes, which have stabilized. His son also had elevated liver enzymes, which have normalized.  He experiences seasonal epistaxis, managed with Vaseline and saline spray, and uses two humidifiers at home. He also uses a CPAP machine for sleep apnea.  He has a history of diabetic retinopathy and his recent A1c was 5.5. No current symptoms of chest pain, headaches, vision changes, or dizziness.  He experiences occasional nausea, using Zofran  approximately once a month. He has a history of nausea since childhood and finds that injecting Ozempic  in his arms reduces nausea.  He reports occasional weather-related headaches, which he manages with Vaseline to prevent colds and other  symptoms.  He has a toenail issue persisting for 10-12 months, managed with over-the-counter polish. No numbness or tingling in his legs, and he can feel sensations in his feet.  He monitors his blood pressure at home, with readings typically around 127/90. He is concerned about potential sexual side effects of increasing his lisinopril  dosage.   Most recent fall risk assessment:     04/12/2024    8:49 AM  Fall Risk   Falls in the past year? 1  Number falls in past yr: 0  Injury with Fall? 0  Risk for fall due to : No Fall Risks  Follow up Falls evaluation completed     Most recent depression screenings:    04/12/2024    8:48 AM 01/31/2024   10:04 AM  PHQ 2/9 Scores  PHQ - 2 Score 2 0  PHQ- 9 Score 3 0    Vision:Within last year, Dental: No current dental problems, and PSA: Agrees to PSA testing Patient Active Problem List   Diagnosis Date Noted   Diabetes mellitus treated with injections of non-insulin  medication ([name]) 04/12/2024   Hyperlipidemia associated with type 2 diabetes mellitus ([name]) 04/12/2024   Onychomycosis 04/12/2024   Physical exam, annual 04/07/2023   Allergic rhinitis 05/04/2022   Disorder of salivary gland 05/04/2022   Fatty liver 05/04/2022   Gastroesophageal reflux disease 05/04/2022   History of adenomatous polyp of colon 05/04/2022   Migraine 05/04/2022   Essential hypertension 05/04/2022   Morbid obesity ([name]) 05/04/2022   Class 3 severe obesity with serious comorbidity and body mass index (BMI) of 40.0 to 44.9 in adult [facility]) 04/30/2022   Encounter to establish care with new doctor 04/30/2022   Hemochromatosis    Benign essential HTN 08/07/2018   Obesity (BMI 30-39.9) 08/07/2018   OSA on CPAP    Past Medical History:  Diagnosis Date   Allergy    Seasonal but take Allegra daily   Back pain    Elevated LFTs    Fatty liver    GERD (gastroesophageal reflux disease)    H/O multiple allergies    Heartburn    Hemochromatosis    HTN (hypertension)    white coat component   Joint pain    Migraines    OSA on CPAP    severe OSA with AHI 92/hr by sleep study 2014 on CPAP   Prediabetes    Sleep apnea    Past Surgical History:  Procedure Laterality Date   none     Social History[1] Family History  Problem Relation Age of Onset   Lymphoma Mother    Cancer Mother    Early death Mother    Hypertension Father    Diabetes  Father    Obesity Father    Allergies[2]    Patient Care Team: Kayla Jeoffrey RAMAN, FNP as PCP - General (Family Medicine)   Review of Systems  Constitutional: Negative.   HENT: Negative.    Eyes: Negative.   Respiratory: Negative.    Cardiovascular: Negative.   Gastrointestinal: Negative.   Genitourinary: Negative.   Musculoskeletal: Negative.   Skin: Negative.   Neurological: Negative.   Endo/Heme/Allergies: Negative.   Psychiatric/Behavioral: Negative.    All other systems reviewed and are negative.     Objective:    BP 126/78 (BP Location: Left Arm, Patient Position: Sitting, Cuff Size: Large)   Pulse 97   Ht 5' 8 (1.727 m)   Wt 242 lb 12.8 oz (110.1 kg)   SpO2 98%  BMI 36.92 kg/m  BP Readings from Last 3 Encounters:  04/12/24 126/78  01/31/24 130/85  08/03/23 120/82   Wt Readings from Last 3 Encounters:  04/12/24 242 lb 12.8 oz (110.1 kg)  01/31/24 244 lb 9.6 oz (110.9 kg)  08/03/23 240 lb (108.9 kg)      Physical Exam Vitals and nursing note reviewed.  Constitutional:      Appearance: Normal appearance. He is obese.  HENT:     Head: Normocephalic and atraumatic.     Right Ear: Tympanic membrane, ear canal and external ear normal.     Left Ear: Tympanic membrane, ear canal and external ear normal.     Nose: Nose normal.     Mouth/Throat:     Mouth: Mucous membranes are moist.     Pharynx: Oropharynx is clear.  Eyes:     Extraocular Movements: Extraocular movements intact.     Right eye: Normal extraocular motion and no nystagmus.     Left eye: Normal extraocular motion and no nystagmus.     Conjunctiva/sclera: Conjunctivae normal.     Pupils: Pupils are equal, round, and reactive to light.  Cardiovascular:     Rate and Rhythm: Normal rate and regular rhythm.     Pulses: Normal pulses.     Heart sounds: Normal heart sounds.  Pulmonary:     Effort: Pulmonary effort is normal.     Breath sounds: Normal breath sounds.  Abdominal:     General:  Bowel sounds are normal.     Palpations: Abdomen is soft.  Genitourinary:    Comments: Deferred using shared decision making Musculoskeletal:        General: Normal range of motion.     Cervical back: Normal range of motion and neck supple.  Skin:    General: Skin is warm and dry.     Capillary Refill: Capillary refill takes less than 2 seconds.  Neurological:     General: No focal deficit present.     Mental Status: He is alert. Mental status is at baseline.  Psychiatric:        Mood and Affect: Mood normal.        Speech: Speech normal.        Behavior: Behavior normal.        Thought Content: Thought content normal.        Cognition and Memory: Cognition and memory normal.        Judgment: Judgment normal.       No results found for any visits on 04/12/24. Last CBC Lab Results  Component Value Date   WBC 7.8 04/06/2024   HGB 18.3 (H) 04/06/2024   HCT 55.8 (H) 04/06/2024   MCV 91.6 04/06/2024   MCH 30.0 04/06/2024   RDW 13.0 04/06/2024   PLT 229 04/06/2024   Last metabolic panel Lab Results  Component Value Date   GLUCOSE 107 (H) 04/06/2024   NA 137 04/06/2024   K 4.6 04/06/2024   CL 101 04/06/2024   CO2 27 04/06/2024   BUN 12 04/06/2024   CREATININE 0.82 04/06/2024   EGFR 101 04/06/2024   CALCIUM  9.7 04/06/2024   PROT 6.7 04/06/2024   ALBUMIN 4.7 01/08/2020   LABGLOB 2.3 10/17/2019   AGRATIO 2.0 10/17/2019   BILITOT 0.6 04/06/2024   ALKPHOS 105 10/17/2019   AST 28 04/06/2024   ALT 47 (H) 04/06/2024   Last lipids Lab Results  Component Value Date   CHOL 132 04/06/2024   HDL 59 04/06/2024  LDLCALC 59 04/06/2024   TRIG 63 04/06/2024   CHOLHDL 2.2 04/06/2024   Last hemoglobin A1c Lab Results  Component Value Date   HGBA1C 5.5 04/06/2024   Last thyroid functions Lab Results  Component Value Date   TSH 1.730 10/17/2019   T3TOTAL 121 10/17/2019   T4TOTAL 7.4 10/17/2019   Last vitamin D  Lab Results  Component Value Date   VD25OH 45  04/06/2024   Last vitamin B12 and Folate Lab Results  Component Value Date   VITAMINB12 462 10/17/2019   FOLATE 10.2 10/17/2019         Assessment & Plan:    Routine Health Maintenance and Physical Exam Immunization History  Administered Date(s) Administered   Influenza-Unspecified 12/02/2023   Moderna Sars-Covid-2 Vaccination 05/18/2019, 06/19/2019, 01/21/2020   Td (Adult) 01/08/2004   Tdap 03/18/2011, 02/06/2021   Zoster, Live 08/10/2021, 02/10/2022    Health Maintenance  Topic Date Due   FOOT EXAM  09/30/2023   COVID-19 Vaccine (4 - 2025-26 season) 04/28/2024 (Originally 11/07/2023)   Pneumococcal Vaccine: 50+ Years (1 of 2 - PCV) 01/30/2025 (Originally 07/20/1983)   Hepatitis B Vaccines 19-59 Average Risk (1 of 3 - 19+ 3-dose series) 01/30/2025 (Originally 07/20/1983)   HEMOGLOBIN A1C  10/04/2024   OPHTHALMOLOGY EXAM  01/03/2025   Diabetic kidney evaluation - eGFR measurement  04/06/2025   Diabetic kidney evaluation - Urine ACR  04/06/2025   Colonoscopy  06/05/2025   DTaP/Tdap/Td (3 - Td or Tdap) 02/07/2031   Influenza Vaccine  Completed   HPV VACCINES (No Doses Required) Completed   Meningococcal B Vaccine  Aged Out   Hepatitis C Screening  Discontinued   HIV Screening  Discontinued   Zoster Vaccines- Shingrix  Discontinued    Discussed health benefits of physical activity, and encouraged him to engage in regular exercise appropriate for his age and condition.  Problem List Items Addressed This Visit       Cardiovascular and Mediastinum   Essential hypertension   Relevant Orders   EKG 12-Lead     Respiratory   OSA on CPAP     Digestive   Gastroesophageal reflux disease   Relevant Medications   ondansetron  (ZOFRAN ) 4 MG tablet     Endocrine   Diabetes mellitus treated with injections of non-insulin  medication ([name])   Hyperlipidemia associated with type 2 diabetes mellitus ([name])     Musculoskeletal and Integument   Onychomycosis   Relevant Medications    ciclopirox  (PENLAC ) 8 % solution     Other   Hemochromatosis   Morbid obesity ([name])   Physical exam, annual - Primary   Relevant Orders   EKG 12-Lead    Assessment and Plan Assessment & Plan Hemochromatosis Elevated hemoglobin increases thrombosis risk. No symptoms or current phlebotomy. Possible hereditary component. - Encouraged phlebotomy however patient declined. - Investigate blood donation facilities during lab draws. - Monitor for thrombosis symptoms.  Type 2 diabetes mellitus with diabetic retinopathy Diabetes well-controlled with A1c at 5.5. Retinopathy improved. - Continue current diabetes management regimen. - Foot exam today. - HTN and HLD well controlled. On lisinopril  and Rosuvastatin   Essential hypertension Blood pressure generally acceptable. Lisinopril  effective. Concerns about sexual side effects with increased dosage. - Continue current lisinopril  dosage. - Bring home blood pressure machine for calibration. - Monitor for lisinopril  side effects.  Obstructive sleep apnea Continues CPAP therapy without issues. - Continue CPAP therapy.  Gastroesophageal reflux disease GERD symptoms controlled with omeprazole . - Continue omeprazole  40 mg daily.  Hyperlipidemia Cholesterol controlled with  rosuvastatin . - Continue rosuvastatin  10 mg every three days.  Class 3 severe obesity Obesity management ongoing with semaglutide  every six days. - Continue semaglutide  2 mg every six days.  Onychomycosis Chronic toenail fungal infection. No significant discomfort. Oral antifungal not recommended. - Use antifungal nail polish daily and clean with alcohol weekly.  General health maintenance Routine health maintenance discussed. Colonoscopy due next year. PSA normal. Declined vaccines. Discussed vitamin D . - Schedule colonoscopy next year. - Consider over-the-counter vitamin D  1000 mg daily.    Return in about 6 months (around 10/10/2024) for chronic follow-up  with labs 1 week prior.    Jeoffrey GORMAN Barrio, FNP [facility]        [1]  Social History Tobacco Use   Smoking status: Former    Current packs/day: 0.00    Average packs/day: 1 pack/day for 10.0 years (10.0 ttl pk-yrs)    Types: Cigarettes    Start date: 03/09/1983    Quit date: 03/08/1993    Years since quitting: 31.1   Smokeless tobacco: Never  Vaping Use   Vaping status: Never Used  Substance Use Topics   Alcohol use: Yes    Alcohol/week: 12.0 standard drinks of alcohol    Types: 12 Shots of liquor per week   Drug use: Never  [2]  Allergies Allergen Reactions   Mounjaro  [Tirzepatide ] Other (See Comments)    Injection site reaction   "

## 2024-04-13 LAB — TEST AUTHORIZATION

## 2024-04-13 LAB — CBC WITH DIFFERENTIAL/PLATELET
Absolute Lymphocytes: 2543 {cells}/uL (ref 850–3900)
Absolute Monocytes: 749 {cells}/uL (ref 200–950)
Basophils Absolute: 62 {cells}/uL (ref 0–200)
Basophils Relative: 0.8 %
Eosinophils Absolute: 39 {cells}/uL (ref 15–500)
Eosinophils Relative: 0.5 %
HCT: 55.8 % — ABNORMAL HIGH (ref 39.4–51.1)
Hemoglobin: 18.3 g/dL — ABNORMAL HIGH (ref 13.2–17.1)
MCH: 30 pg (ref 27.0–33.0)
MCHC: 32.8 g/dL (ref 31.6–35.4)
MCV: 91.6 fL (ref 81.4–101.7)
MPV: 10.5 fL (ref 7.5–12.5)
Monocytes Relative: 9.6 %
Neutro Abs: 4407 {cells}/uL (ref 1500–7800)
Neutrophils Relative %: 56.5 %
Platelets: 229 10*3/uL (ref 140–400)
RBC: 6.09 Million/uL — ABNORMAL HIGH (ref 4.20–5.80)
RDW: 13 % (ref 11.0–15.0)
Total Lymphocyte: 32.6 %
WBC: 7.8 10*3/uL (ref 3.8–10.8)

## 2024-04-13 LAB — COMPREHENSIVE METABOLIC PANEL WITH GFR
AG Ratio: 1.9 (calc) (ref 1.0–2.5)
ALT: 47 U/L — ABNORMAL HIGH (ref 9–46)
AST: 28 U/L (ref 10–35)
Albumin: 4.4 g/dL (ref 3.6–5.1)
Alkaline phosphatase (APISO): 86 U/L (ref 35–144)
BUN: 12 mg/dL (ref 7–25)
CO2: 27 mmol/L (ref 20–32)
Calcium: 9.7 mg/dL (ref 8.6–10.3)
Chloride: 101 mmol/L (ref 98–110)
Creat: 0.82 mg/dL (ref 0.70–1.30)
Globulin: 2.3 g/dL (ref 1.9–3.7)
Glucose, Bld: 107 mg/dL — ABNORMAL HIGH (ref 65–99)
Potassium: 4.6 mmol/L (ref 3.5–5.3)
Sodium: 137 mmol/L (ref 135–146)
Total Bilirubin: 0.6 mg/dL (ref 0.2–1.2)
Total Protein: 6.7 g/dL (ref 6.1–8.1)
eGFR: 101 mL/min/{1.73_m2}

## 2024-04-13 LAB — PSA: PSA: 1.84 ng/mL

## 2024-04-13 LAB — IRON,TIBC AND FERRITIN PANEL
%SAT: 29 % (ref 20–48)
Ferritin: 286 ng/mL (ref 38–380)
Iron: 90 ug/dL (ref 50–180)
TIBC: 312 ug/dL (ref 250–425)

## 2024-04-13 LAB — LIPID PANEL
Cholesterol: 132 mg/dL
HDL: 59 mg/dL
LDL Cholesterol (Calc): 59 mg/dL
Non-HDL Cholesterol (Calc): 73 mg/dL
Total CHOL/HDL Ratio: 2.2 (calc)
Triglycerides: 63 mg/dL

## 2024-04-13 LAB — HEMOGLOBIN A1C
Hgb A1c MFr Bld: 5.5 %
Mean Plasma Glucose: 111 mg/dL
eAG (mmol/L): 6.2 mmol/L

## 2024-04-13 LAB — MICROALBUMIN / CREATININE URINE RATIO
Creatinine, Urine: 60 mg/dL (ref 20–320)
Microalb Creat Ratio: 53 mg/g{creat} — ABNORMAL HIGH
Microalb, Ur: 3.2 mg/dL

## 2024-04-13 LAB — VITAMIN D 25 HYDROXY (VIT D DEFICIENCY, FRACTURES): Vit D, 25-Hydroxy: 45 ng/mL (ref 30–100)

## 2024-10-04 ENCOUNTER — Other Ambulatory Visit

## 2024-10-11 ENCOUNTER — Ambulatory Visit: Admitting: Family Medicine

## 2025-04-11 ENCOUNTER — Other Ambulatory Visit

## 2025-04-18 ENCOUNTER — Encounter: Admitting: Family Medicine
